# Patient Record
Sex: Male | Born: 1985 | Race: Black or African American | Hispanic: No | Marital: Single
Health system: Southern US, Community
[De-identification: ages and names within clinical notes are randomized; demographics above are authoritative.]

---
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
## 2021-06-11 ENCOUNTER — Emergency Department (HOSPITAL_COMMUNITY)

## 2021-06-11 ENCOUNTER — Emergency Department (HOSPITAL_COMMUNITY)
Admission: EM | Admit: 2021-06-11 | Discharge: 2021-06-12 | Disposition: A | Discharge status: Home / Self Care | Attending: Emergency Medicine | Admitting: Emergency Medicine

## 2021-06-11 ENCOUNTER — Other Ambulatory Visit: Payer: Self-pay

## 2021-06-11 DIAGNOSIS — W010XXA Fall on same level from slipping, tripping and stumbling without subsequent striking against object, initial encounter: Secondary | ICD-10-CM | POA: Diagnosis not present

## 2021-06-11 DIAGNOSIS — M545 Low back pain, unspecified: Secondary | ICD-10-CM | POA: Insufficient documentation

## 2021-06-11 DIAGNOSIS — R200 Anesthesia of skin: Secondary | ICD-10-CM | POA: Insufficient documentation

## 2021-06-11 DIAGNOSIS — M5442 Lumbago with sciatica, left side: Secondary | ICD-10-CM

## 2021-06-11 DIAGNOSIS — Y92149 Unspecified place in prison as the place of occurrence of the external cause: Secondary | ICD-10-CM | POA: Diagnosis not present

## 2021-06-11 DIAGNOSIS — W19XXXA Unspecified fall, initial encounter: Secondary | ICD-10-CM

## 2021-06-11 DIAGNOSIS — M546 Pain in thoracic spine: Secondary | ICD-10-CM | POA: Diagnosis not present

## 2021-06-11 DIAGNOSIS — M542 Cervicalgia: Secondary | ICD-10-CM | POA: Insufficient documentation

## 2021-06-11 IMAGING — CR DG HIP (WITH OR WITHOUT PELVIS) 2-3V*L*
3 series · 3 of 3 positions shown · non-contrast
Comparison: None.

CLINICAL DATA: Mechanical fall on wet surface

EXAM:
CERVICAL SPINE - COMPLETE 4+ VIEW
THORACIC SPINE 2 VIEWS;
LUMBAR SPINE - COMPLETE 4+ VIEW
DG HIP (WITH OR WITHOUT PELVIS) 2-3V LEFT

[pelvis ap]
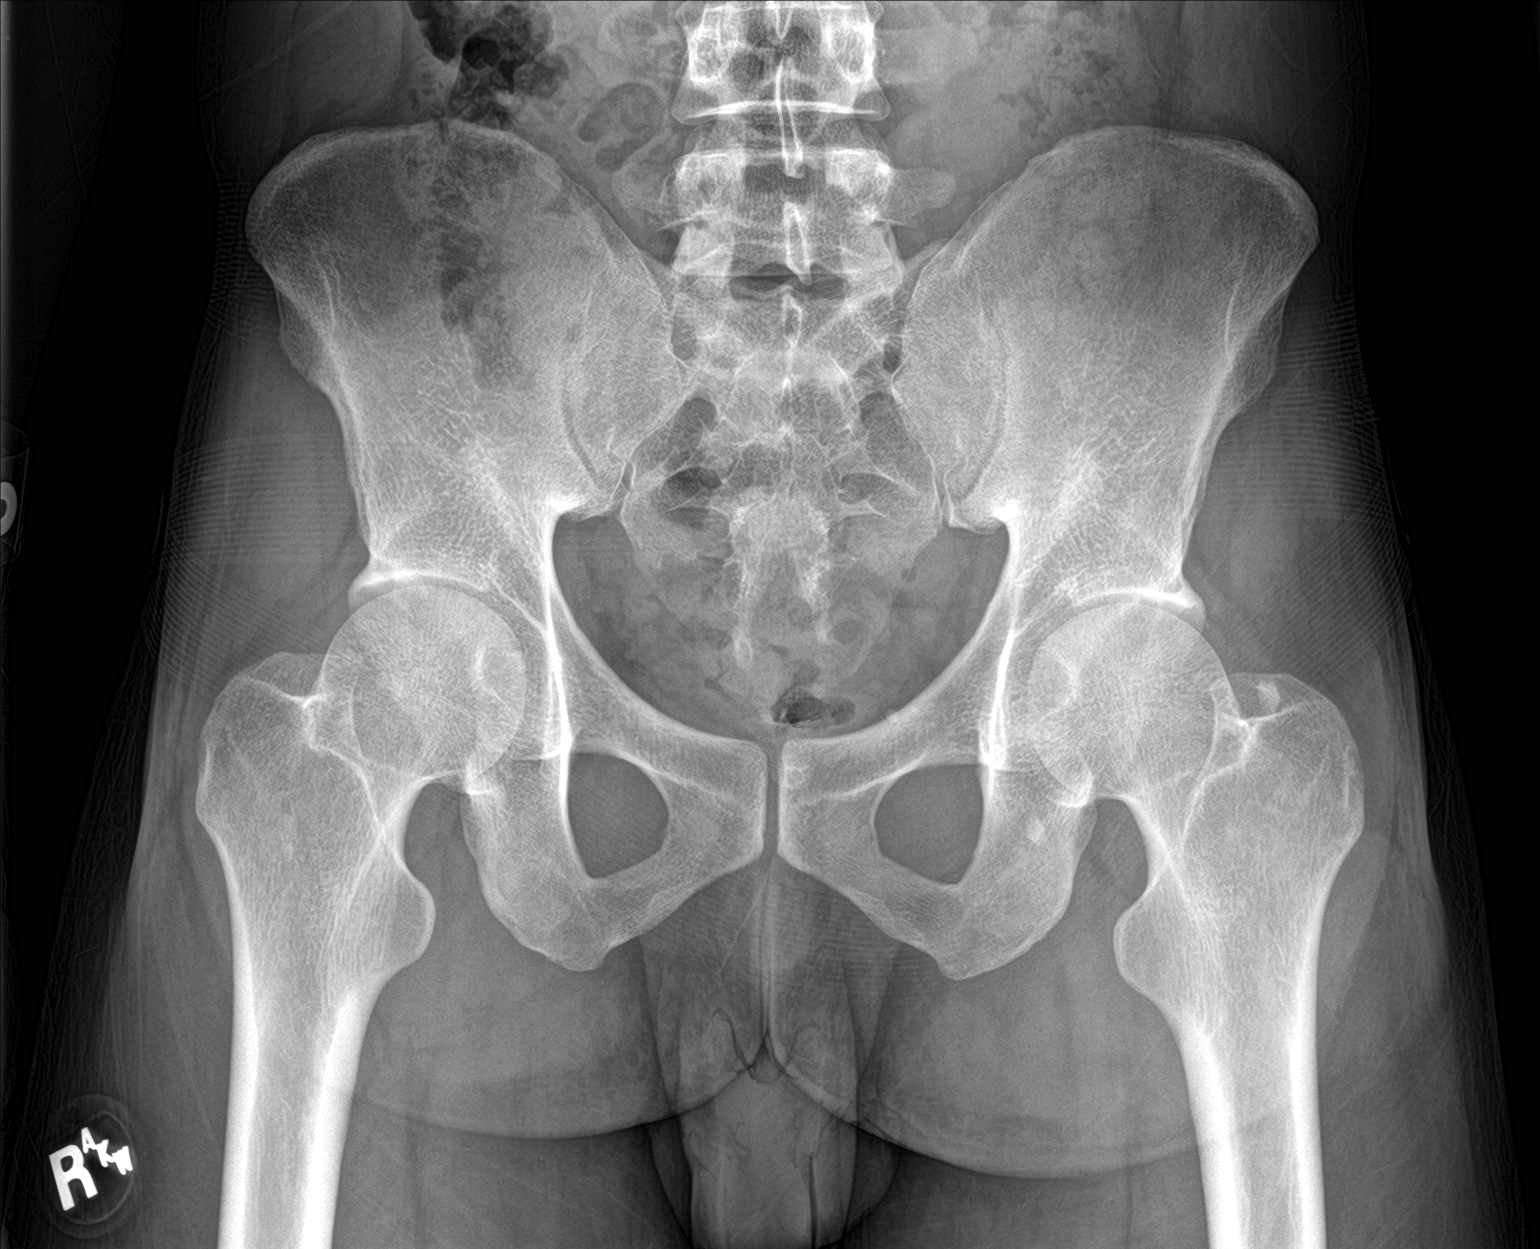

[hip ap]
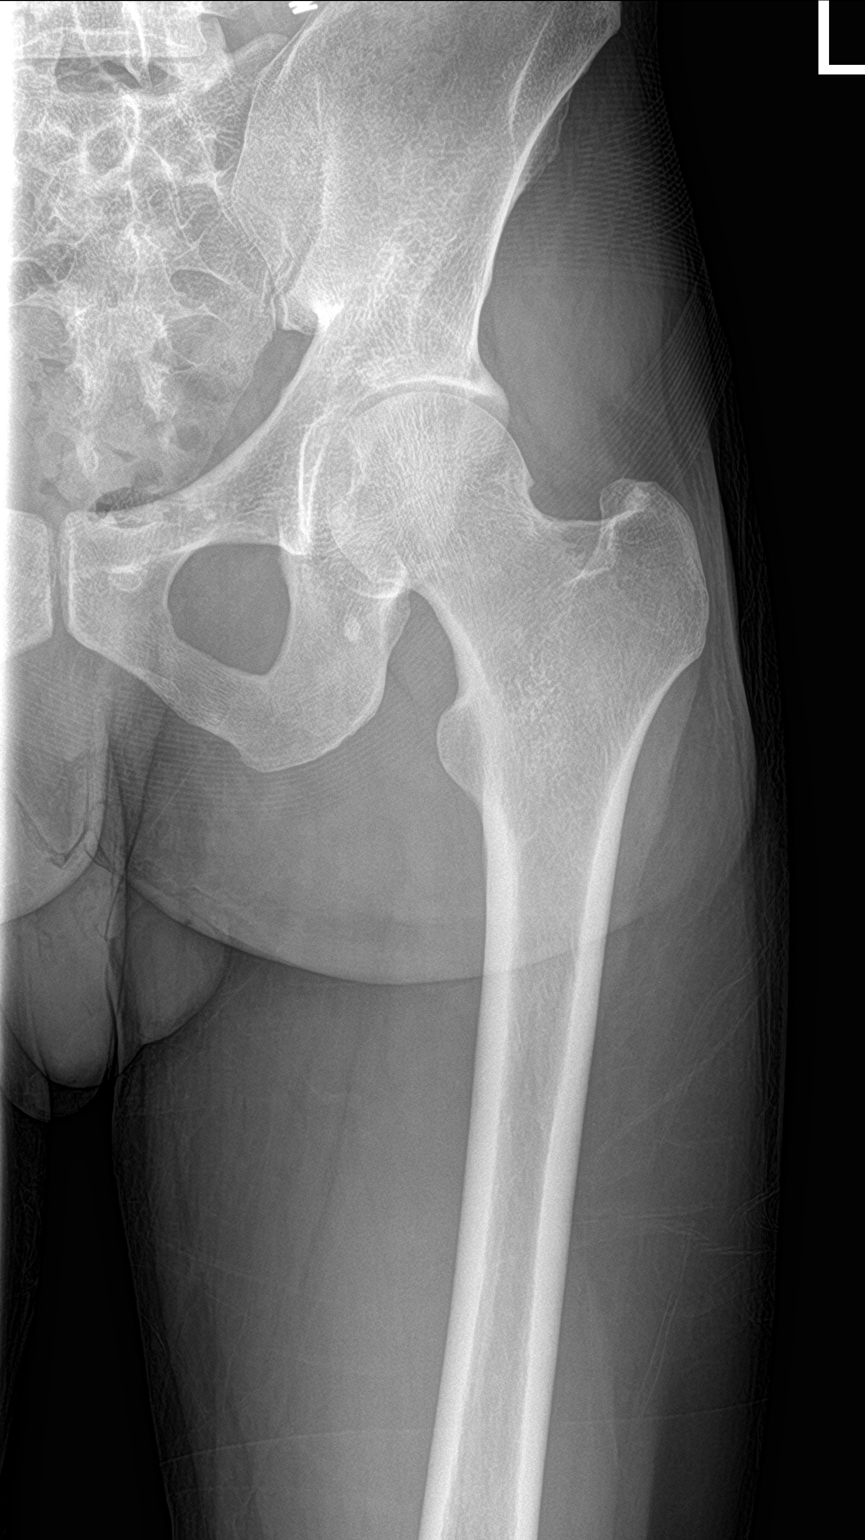

[hip lat]
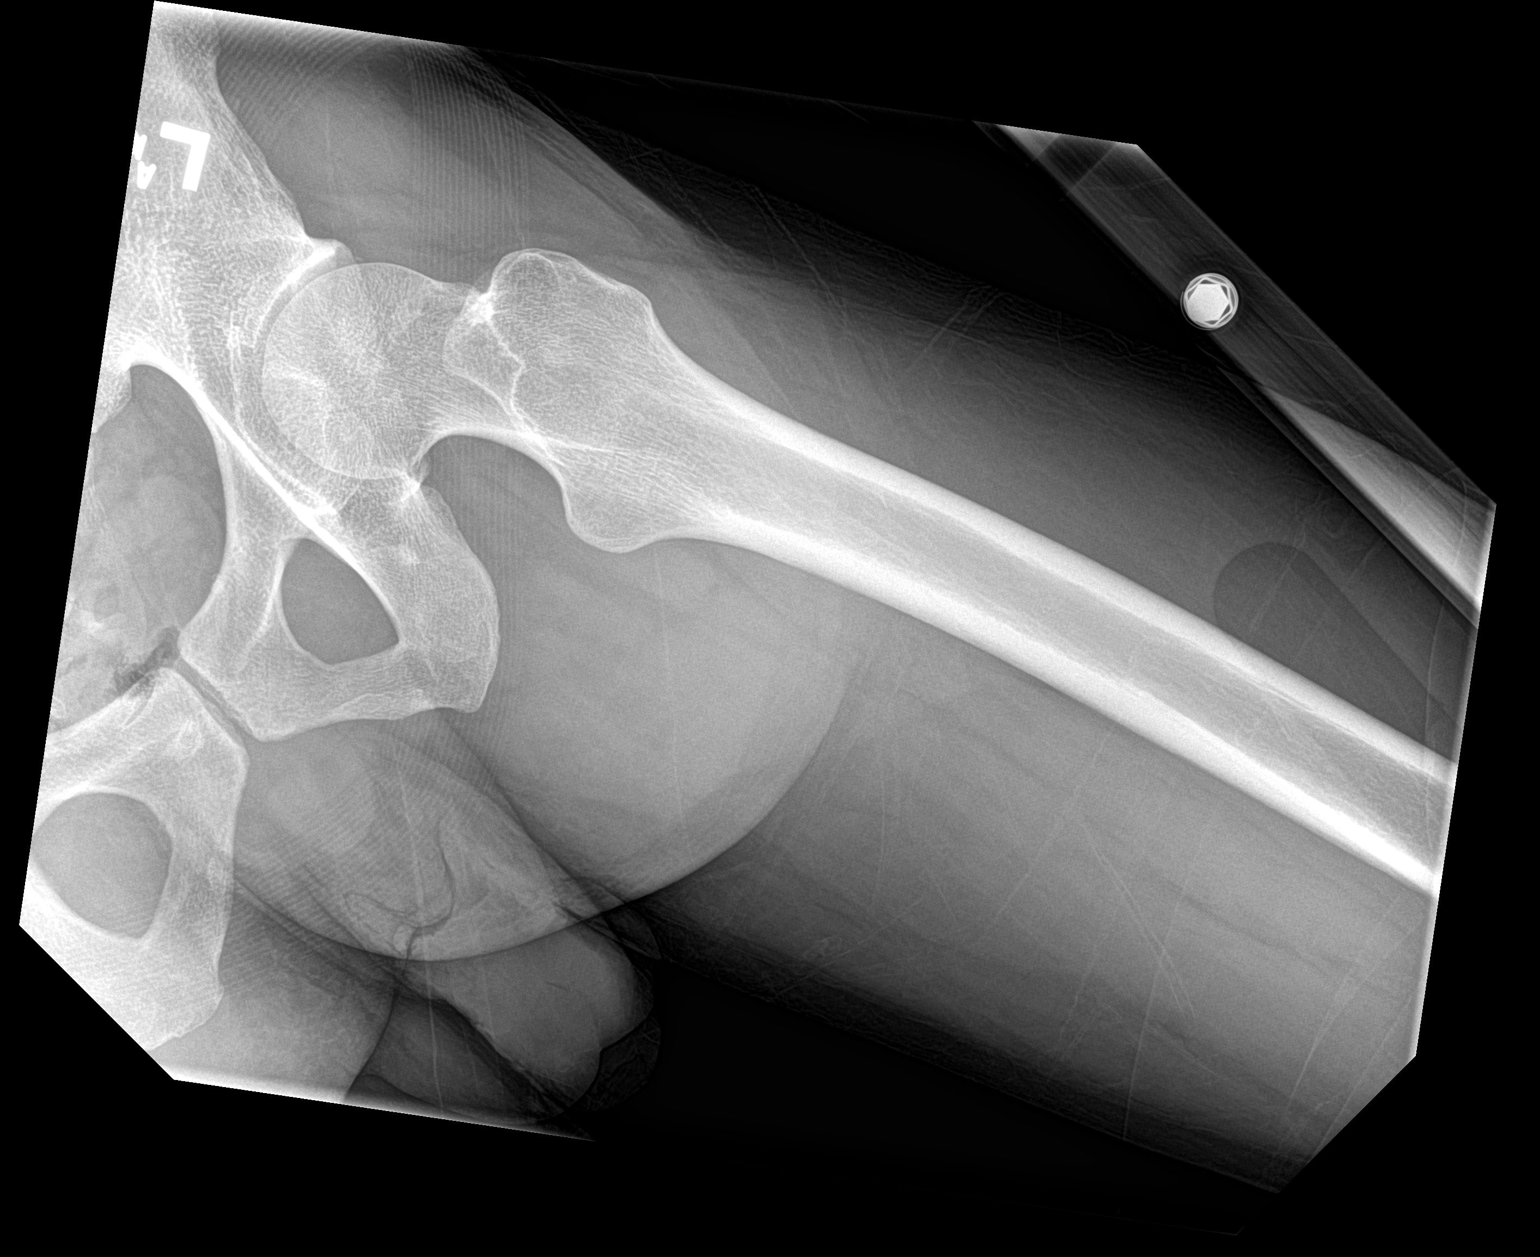

[3 of 3 positions shown; findings below may reference images not displayed]

FINDINGS: Cervical spine:

Stabilization collar in place at the time of exam. No acute cervical
spine fracture or traumatic listhesis. No significant prevertebral
swelling a gas. Airways patent. No acute abnormality in the upper
chest or imaged lung apices.

Thoracic spine:

12 thoracic levels. Upper thoracic levels better visualized on the
cervical spine images. No acute thoracic spine fracture or traumatic
listhesis. Normal bone mineralization. No worrisome osseous lesions.
Included portions of the chest and mediastinum are unremarkable.

Lumbar spine:

Five lumbar levels. No vertebral body fracture or height loss. No
evidence of traumatic listhesis. No spondylolysis or some
spondylolisthesis. Normal bone mineralization. No worrisome osseous
lesions. No acute soft tissue abnormality. Normal bowel gas pattern.

Pelvis:

Bones of the pelvis appear intact and congruent. Question some
partial fusion across the right superior SI joint. Sacral arcs are
contiguous. Proximal femora are intact and normally located. Benign
bone island in the left greater trochanter and inferior pubic ramus.
Soft tissues are unremarkable.
IMPRESSION: No acute traumatic findings in the cervical, thoracic or lumbar
spine.

No acute fracture or traumatic malalignment of the bony pelvis.

## 2021-06-11 IMAGING — CR DG CERVICAL SPINE COMPLETE 4+V
7 series · 7 of 7 positions shown · non-contrast
Comparison: None.

CLINICAL DATA: Mechanical fall on wet surface

EXAM:
CERVICAL SPINE - COMPLETE 4+ VIEW
THORACIC SPINE 2 VIEWS;
LUMBAR SPINE - COMPLETE 4+ VIEW
DG HIP (WITH OR WITHOUT PELVIS) 2-3V LEFT

[c-spine lat]
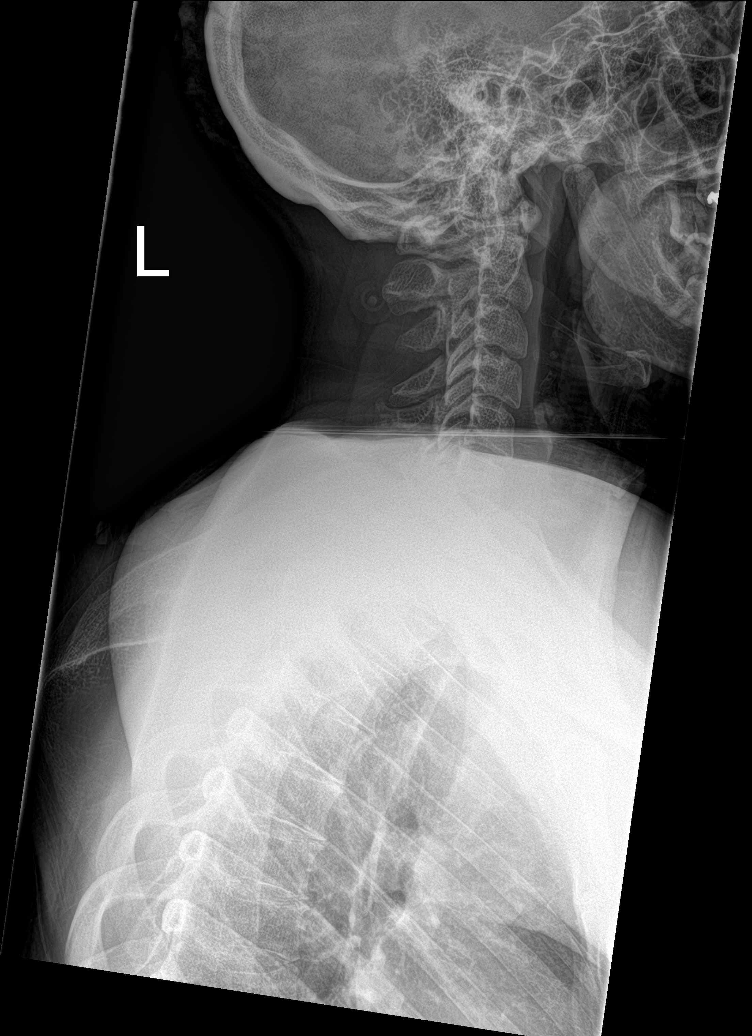

[c-spine obl (1 of 2)]
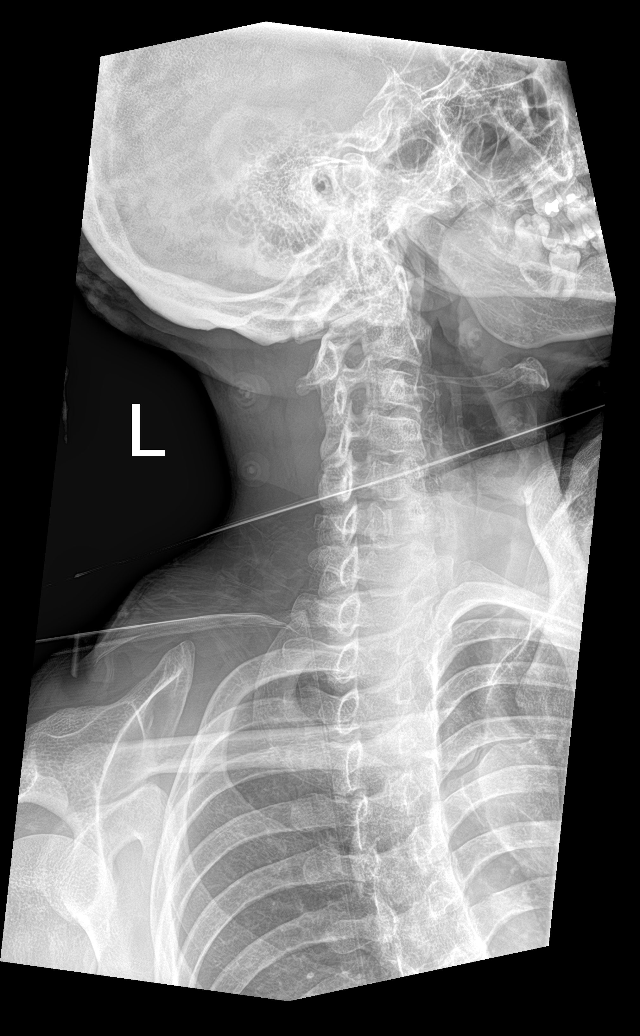

[c-spine obl (2 of 2)]
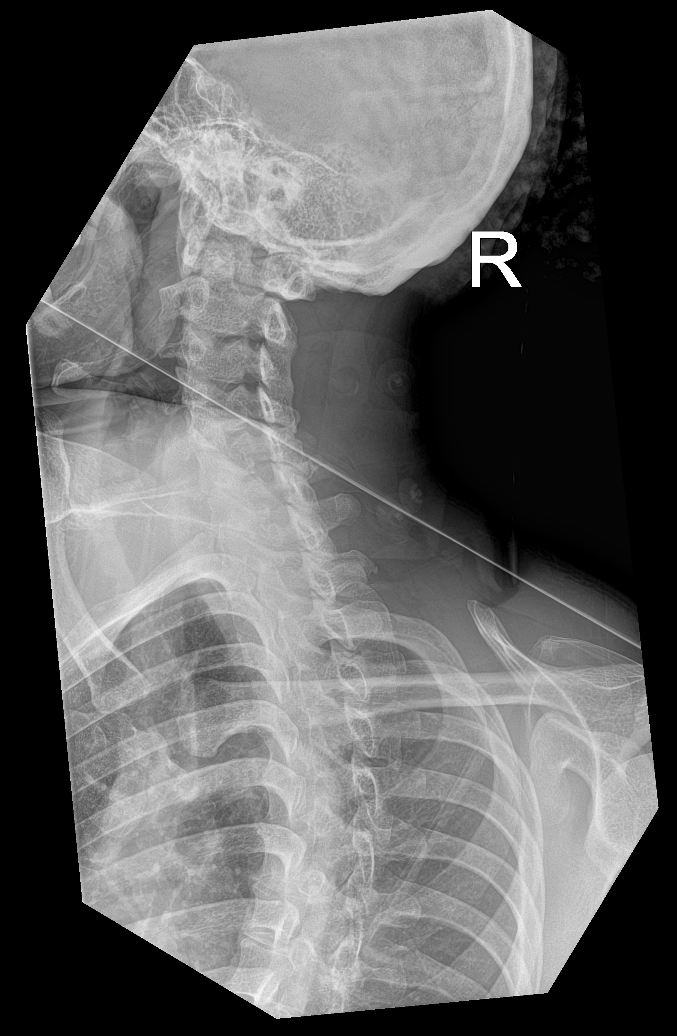

[c-spine open mouth]
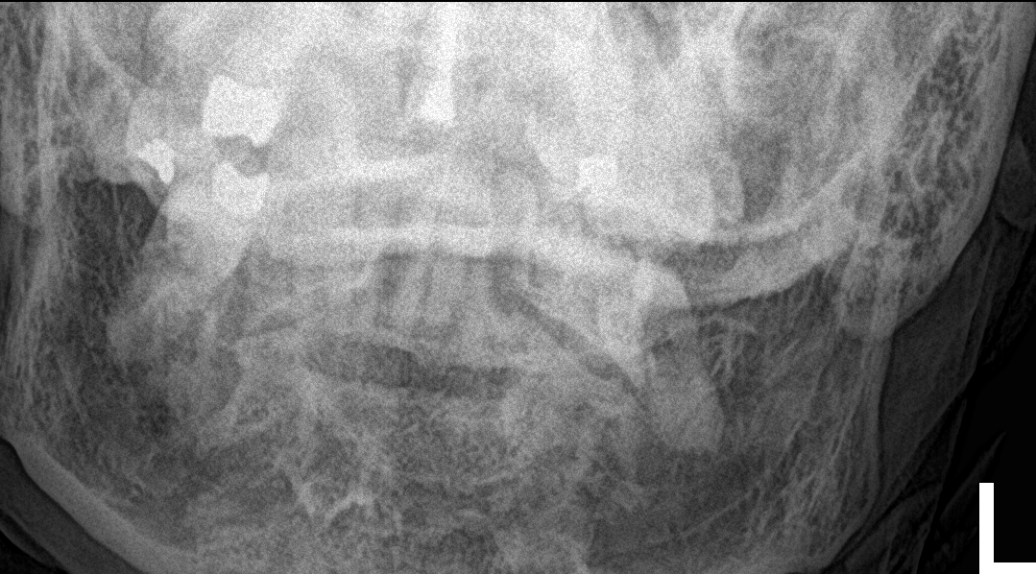

[c-spine swimmers]
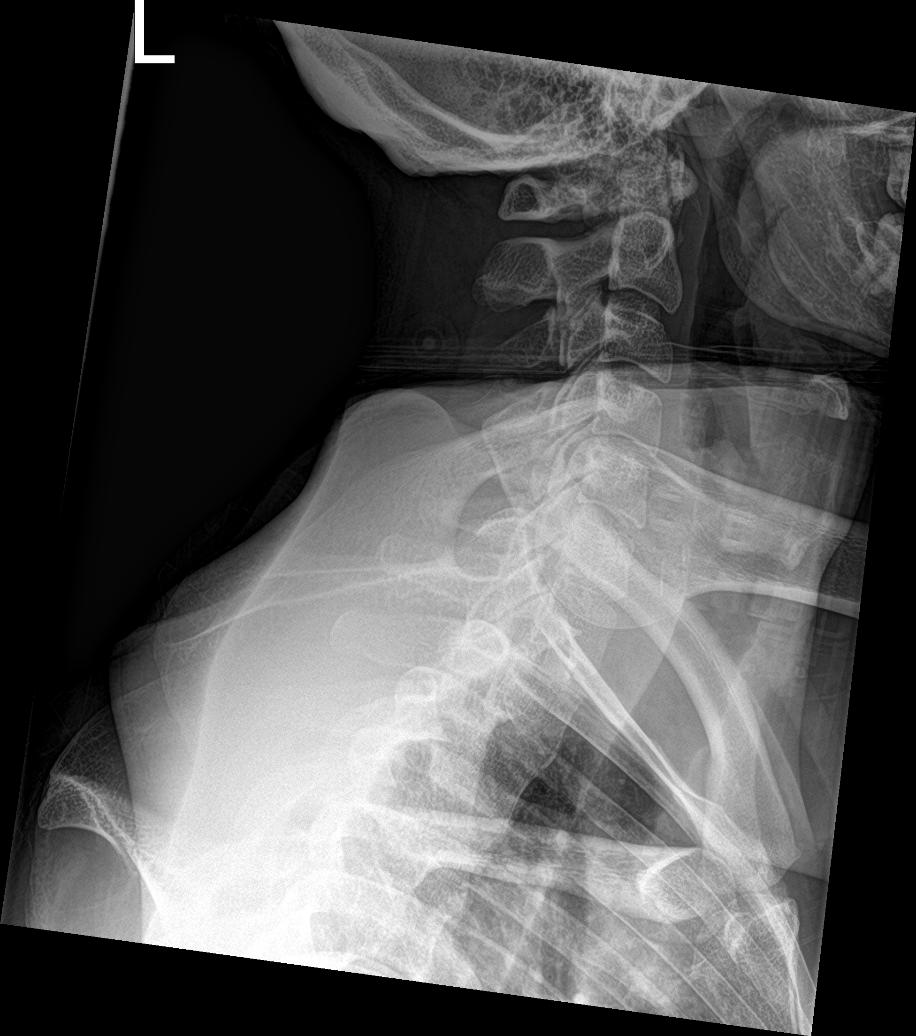

[c-spine ap (1 of 2)]
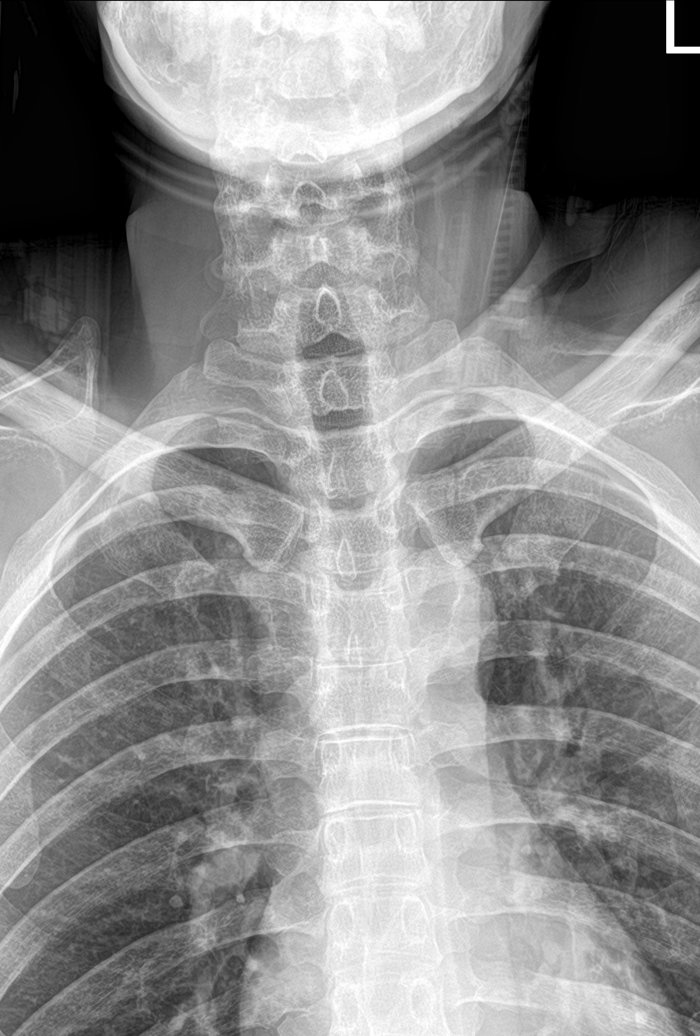

[c-spine ap (2 of 2)]
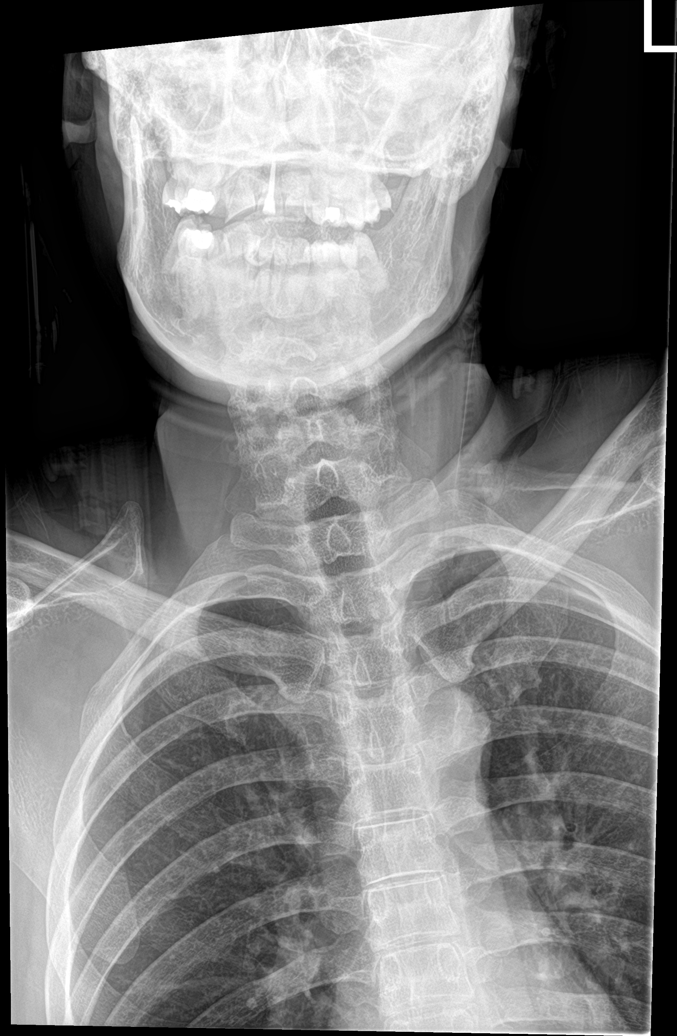

[7 of 7 positions shown; findings below may reference images not displayed]

FINDINGS: Cervical spine:

Stabilization collar in place at the time of exam. No acute cervical
spine fracture or traumatic listhesis. No significant prevertebral
swelling a gas. Airways patent. No acute abnormality in the upper
chest or imaged lung apices.

Thoracic spine:

12 thoracic levels. Upper thoracic levels better visualized on the
cervical spine images. No acute thoracic spine fracture or traumatic
listhesis. Normal bone mineralization. No worrisome osseous lesions.
Included portions of the chest and mediastinum are unremarkable.

Lumbar spine:

Five lumbar levels. No vertebral body fracture or height loss. No
evidence of traumatic listhesis. No spondylolysis or some
spondylolisthesis. Normal bone mineralization. No worrisome osseous
lesions. No acute soft tissue abnormality. Normal bowel gas pattern.

Pelvis:

Bones of the pelvis appear intact and congruent. Question some
partial fusion across the right superior SI joint. Sacral arcs are
contiguous. Proximal femora are intact and normally located. Benign
bone island in the left greater trochanter and inferior pubic ramus.
Soft tissues are unremarkable.
IMPRESSION: No acute traumatic findings in the cervical, thoracic or lumbar
spine.

No acute fracture or traumatic malalignment of the bony pelvis.

## 2021-06-11 IMAGING — CR DG LUMBAR SPINE COMPLETE 4+V
6 series · 6 of 6 positions shown · non-contrast
Comparison: None.

CLINICAL DATA: Mechanical fall on wet surface

EXAM:
CERVICAL SPINE - COMPLETE 4+ VIEW
THORACIC SPINE 2 VIEWS;
LUMBAR SPINE - COMPLETE 4+ VIEW
DG HIP (WITH OR WITHOUT PELVIS) 2-3V LEFT

[l-spine ap]
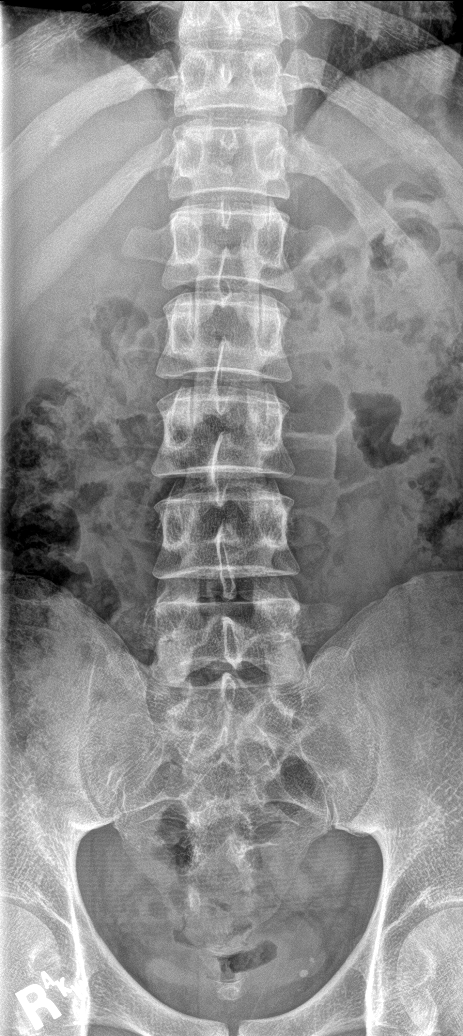

[l-spine obl (1 of 3)]
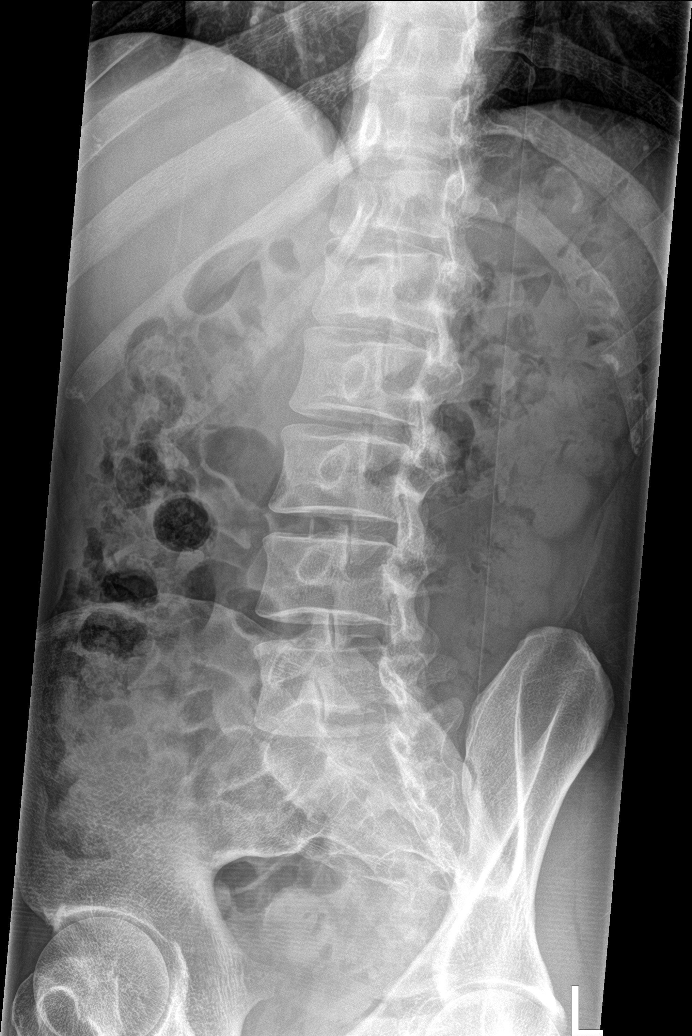

[l-spine obl (2 of 3)]
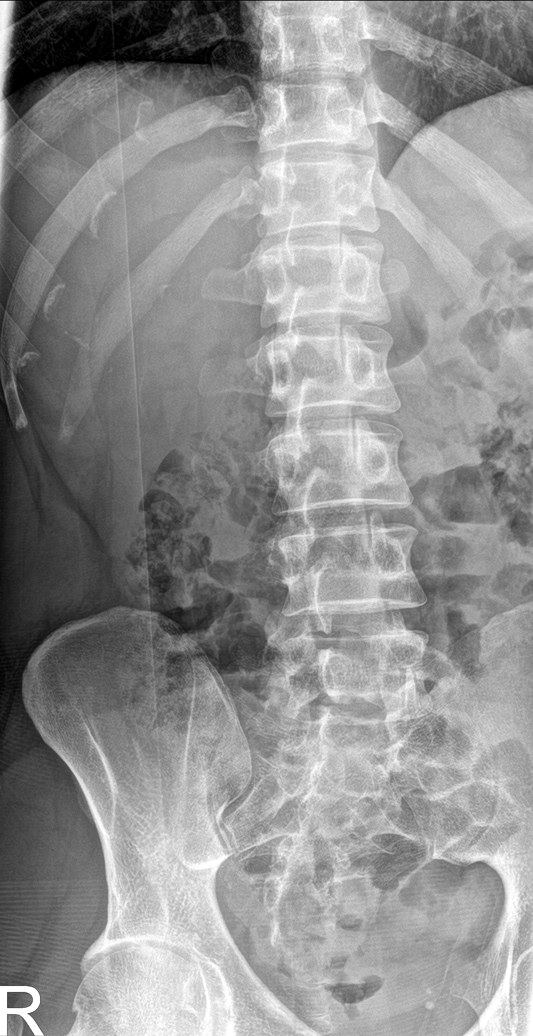

[l-spine lat]
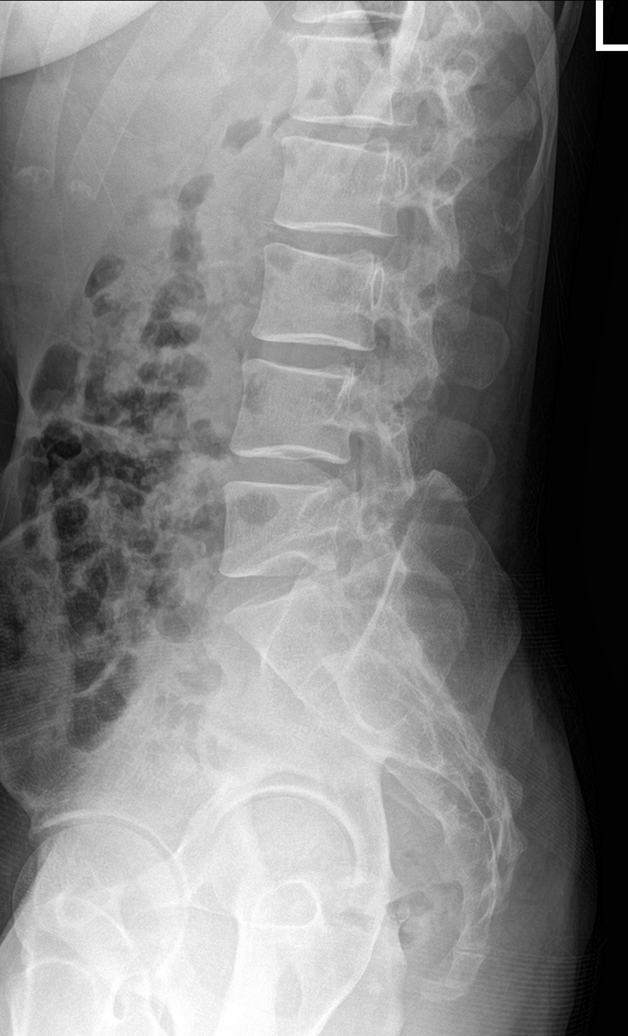

[l-spine spot]
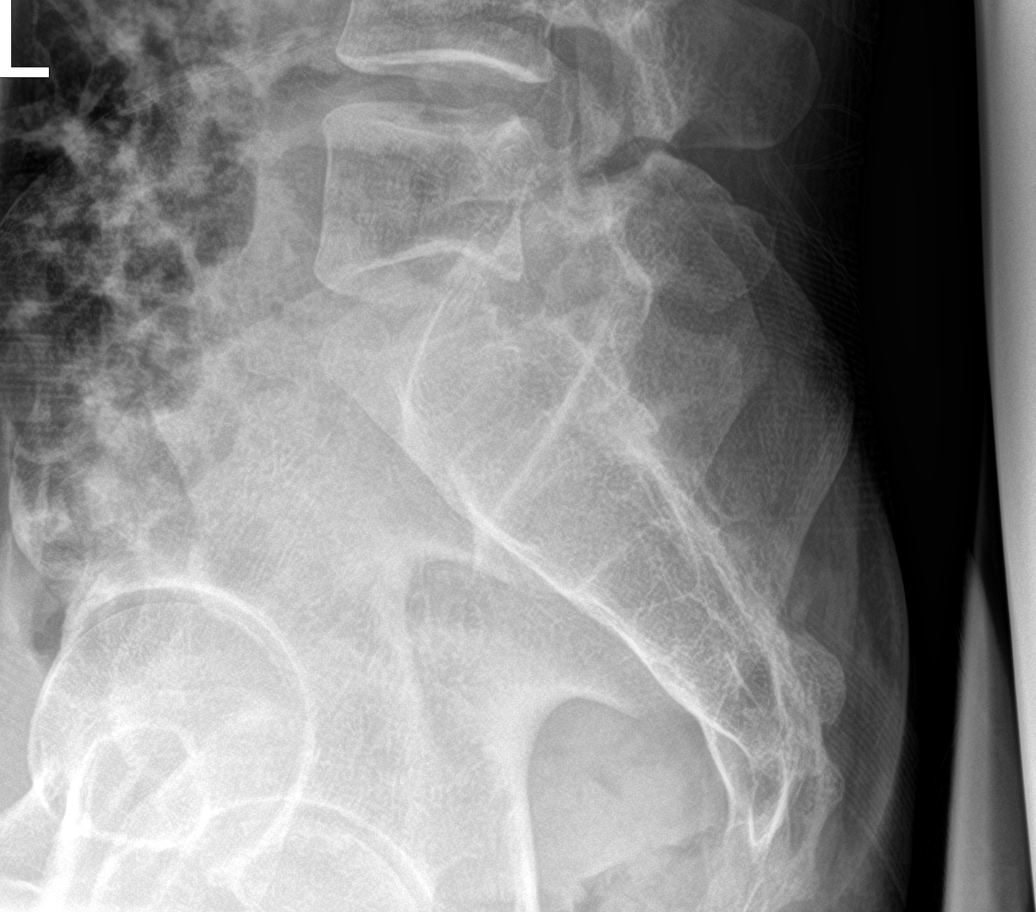

[l-spine obl (3 of 3)]
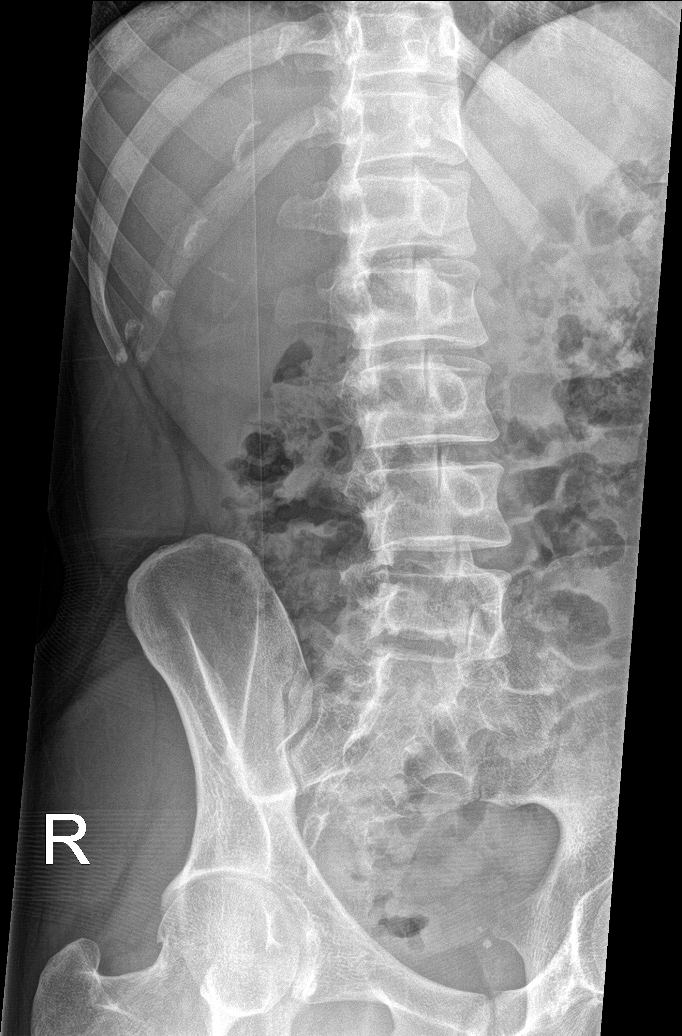

[6 of 6 positions shown; findings below may reference images not displayed]

FINDINGS: Cervical spine:

Stabilization collar in place at the time of exam. No acute cervical
spine fracture or traumatic listhesis. No significant prevertebral
swelling a gas. Airways patent. No acute abnormality in the upper
chest or imaged lung apices.

Thoracic spine:

12 thoracic levels. Upper thoracic levels better visualized on the
cervical spine images. No acute thoracic spine fracture or traumatic
listhesis. Normal bone mineralization. No worrisome osseous lesions.
Included portions of the chest and mediastinum are unremarkable.

Lumbar spine:

Five lumbar levels. No vertebral body fracture or height loss. No
evidence of traumatic listhesis. No spondylolysis or some
spondylolisthesis. Normal bone mineralization. No worrisome osseous
lesions. No acute soft tissue abnormality. Normal bowel gas pattern.

Pelvis:

Bones of the pelvis appear intact and congruent. Question some
partial fusion across the right superior SI joint. Sacral arcs are
contiguous. Proximal femora are intact and normally located. Benign
bone island in the left greater trochanter and inferior pubic ramus.
Soft tissues are unremarkable.
IMPRESSION: No acute traumatic findings in the cervical, thoracic or lumbar
spine.

No acute fracture or traumatic malalignment of the bony pelvis.

## 2021-06-11 IMAGING — CR DG THORACIC SPINE 2V
2 series · 2 of 2 positions shown · non-contrast
Comparison: None.

CLINICAL DATA: Mechanical fall on wet surface

EXAM:
CERVICAL SPINE - COMPLETE 4+ VIEW
THORACIC SPINE 2 VIEWS;
LUMBAR SPINE - COMPLETE 4+ VIEW
DG HIP (WITH OR WITHOUT PELVIS) 2-3V LEFT

[t-spine ap]
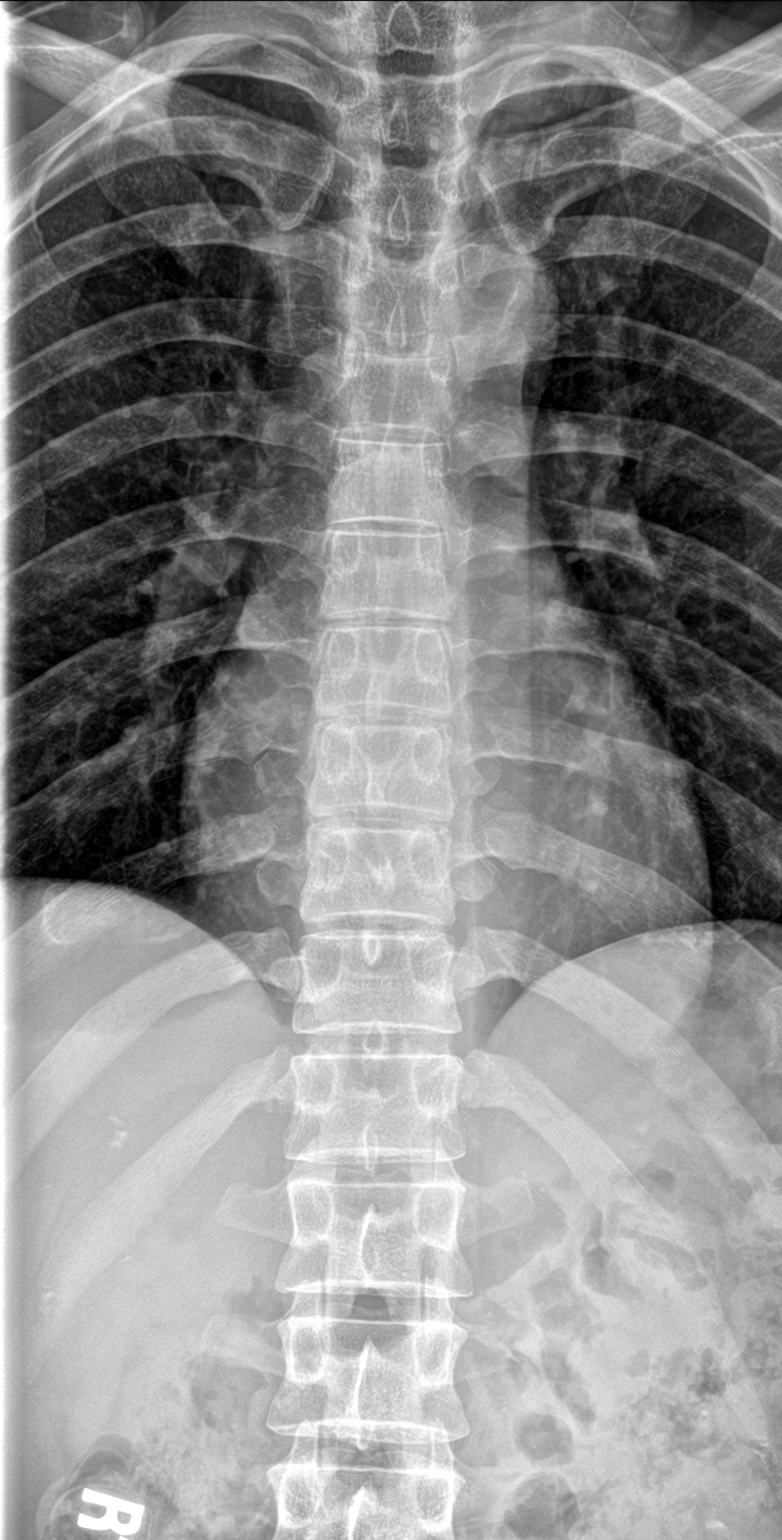

[t-spine lat]
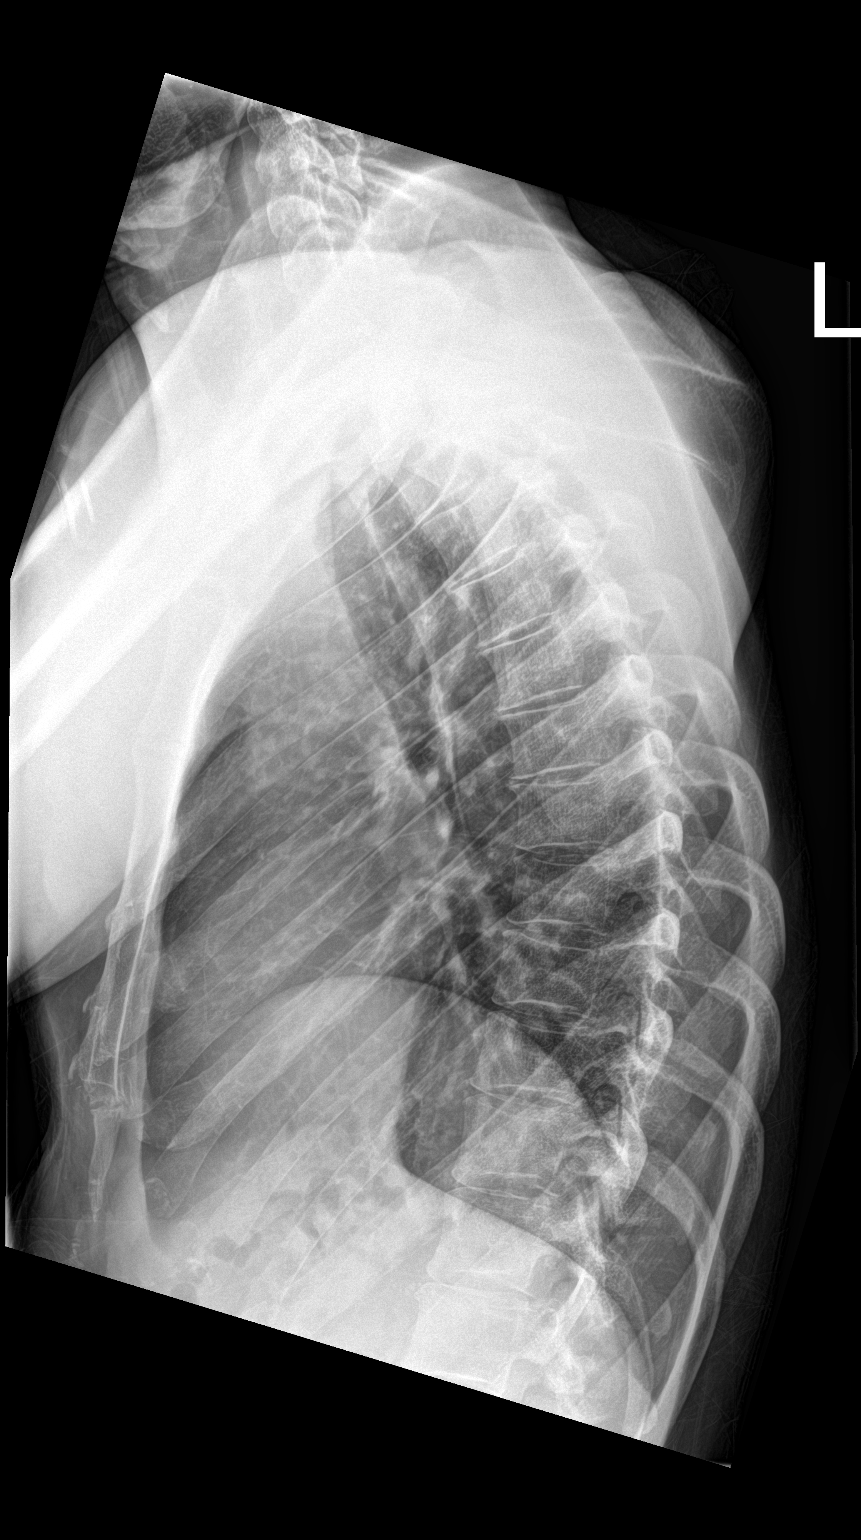

[2 of 2 positions shown; findings below may reference images not displayed]

FINDINGS: Cervical spine:

Stabilization collar in place at the time of exam. No acute cervical
spine fracture or traumatic listhesis. No significant prevertebral
swelling a gas. Airways patent. No acute abnormality in the upper
chest or imaged lung apices.

Thoracic spine:

12 thoracic levels. Upper thoracic levels better visualized on the
cervical spine images. No acute thoracic spine fracture or traumatic
listhesis. Normal bone mineralization. No worrisome osseous lesions.
Included portions of the chest and mediastinum are unremarkable.

Lumbar spine:

Five lumbar levels. No vertebral body fracture or height loss. No
evidence of traumatic listhesis. No spondylolysis or some
spondylolisthesis. Normal bone mineralization. No worrisome osseous
lesions. No acute soft tissue abnormality. Normal bowel gas pattern.

Pelvis:

Bones of the pelvis appear intact and congruent. Question some
partial fusion across the right superior SI joint. Sacral arcs are
contiguous. Proximal femora are intact and normally located. Benign
bone island in the left greater trochanter and inferior pubic ramus.
Soft tissues are unremarkable.
IMPRESSION: No acute traumatic findings in the cervical, thoracic or lumbar
spine.

No acute fracture or traumatic malalignment of the bony pelvis.

## 2021-06-11 MED ORDER — IBUPROFEN 200 MG PO TABS
600.0000 mg | ORAL_TABLET | Freq: Once | ORAL | Status: AC
  Administered 2021-06-11: 600 mg via ORAL
  Filled 2021-06-11: qty 1

## 2021-06-11 NOTE — ED Provider Notes (Signed)
MSE was initiated and I personally evaluated the patient and placed orders (if any) at  10:48 PM on June 11, 2021.  Patient to ED with [facility] from jail after mechanical fall, slipped on wet surface, went backward landing on floor. C/O neck, thoracic and low back pain. No LOC, nausea, vomiting, chest or abdominal pain.   Today's Vitals   06/11/21 2243  BP: (!) 134/94  Resp: 16  Temp: 98.2 F (36.8 C)  TempSrc: Oral  SpO2: 100%   There is no height or weight on file to calculate BMI.  In collar Spine tender over cervical, upper thoracic, lumbar spine. No deformity or step off.   The patient appears stable so that the remainder of the MSE may be completed by another provider.   Elpidio Anis, PA-C 06/11/21 2250    Maia Plan, MD 06/16/21 1301

## 2021-06-11 NOTE — ED Triage Notes (Signed)
Pt arrives via [facility] from [place]. slipped in water and fell onto back, no LOC. C/o left side, left shoulder and lower back pain. C collar in place. 134/98, hr 98, 99% RA.

## 2021-06-12 ENCOUNTER — Emergency Department (HOSPITAL_COMMUNITY)

## 2021-06-12 IMAGING — MR MR THORACIC SPINE W/O CM
4 of 6 series · 21 of 48 positions shown · non-contrast
Comparison: Thoracolumbar radiographs [BB] hours yesterday.

CLINICAL DATA: 34-year-old male status post fall backwards with
thoracolumbar pain.

EXAM:
MRI THORACIC SPINE WITHOUT CONTRAST
TECHNIQUE: Multiplanar, multisequence MR imaging of the thoracic spine was
performed. No intravenous contrast was administered.

[Series 18: T1 · sagittal · 3.3mm · 0.62mm/px · 3 of 9 slices shown (1 of 2)]
[im 1/9]
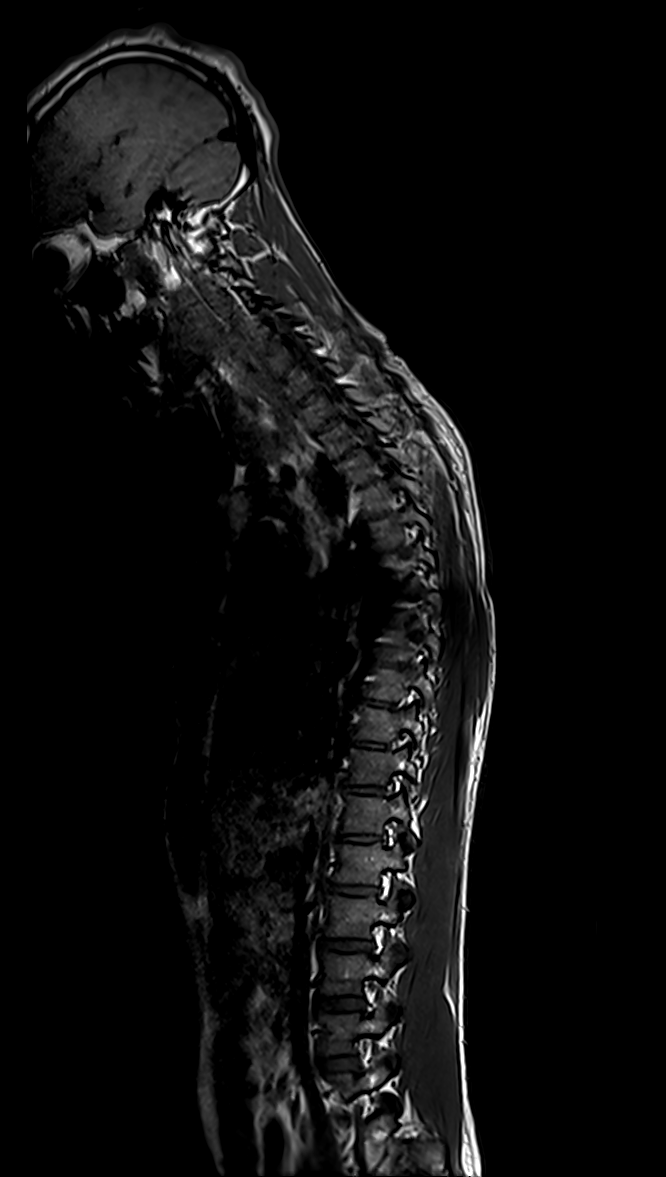
[im 6/9]
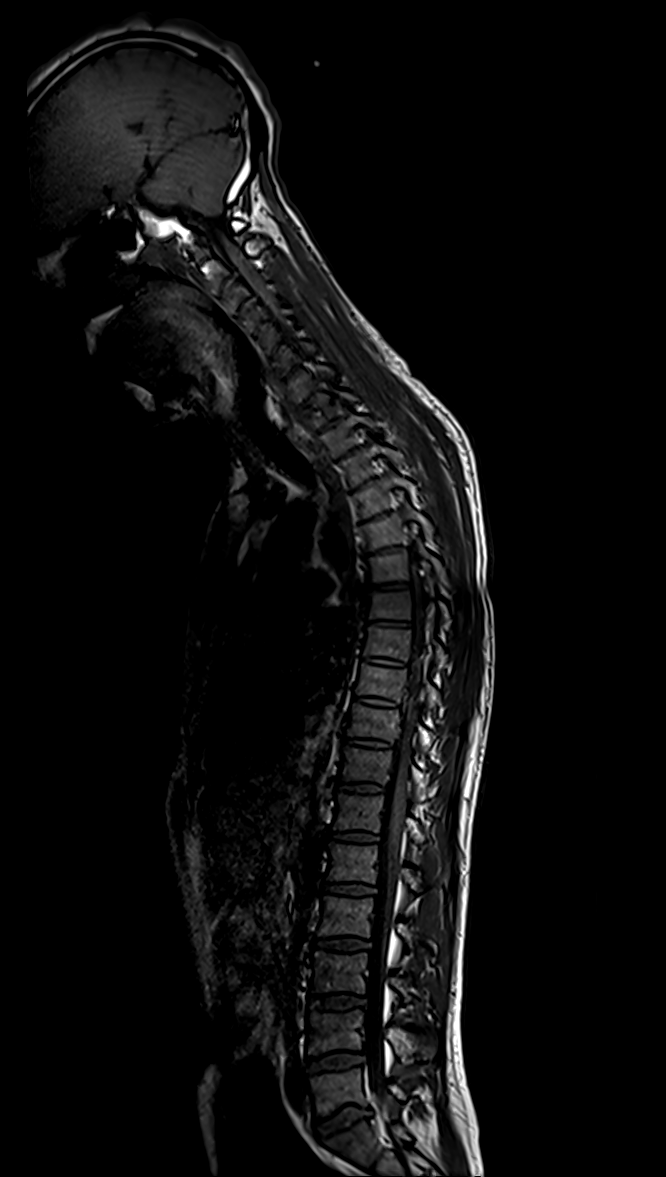
[im 9/9]
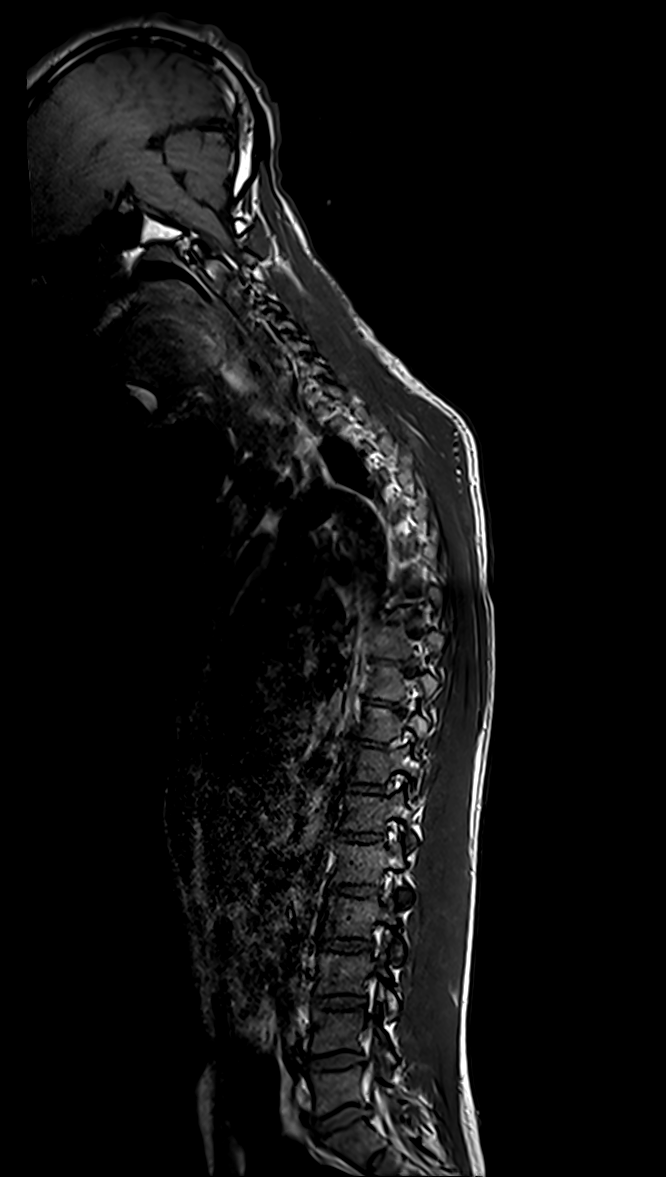

[Series 19: T2 · sagittal · 3.0mm · 0.76mm/px · 6 of 19 slices shown (1 of 2)]
[im 1/19]
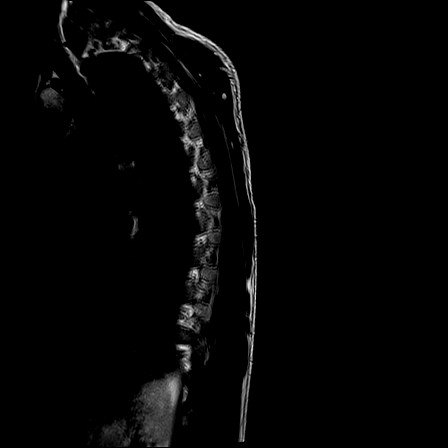
[im 4/19]
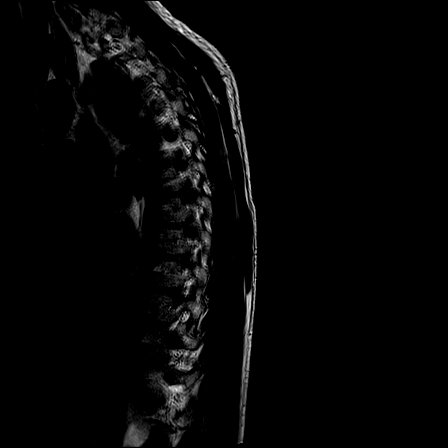
[im 8/19]
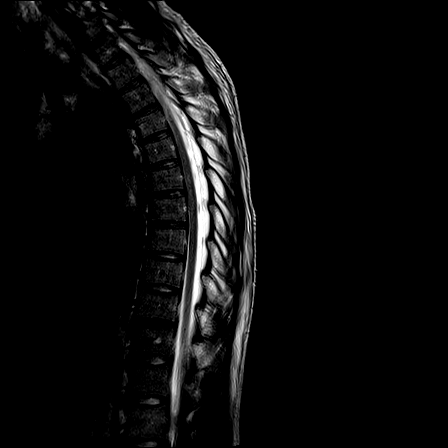
[im 11/19]
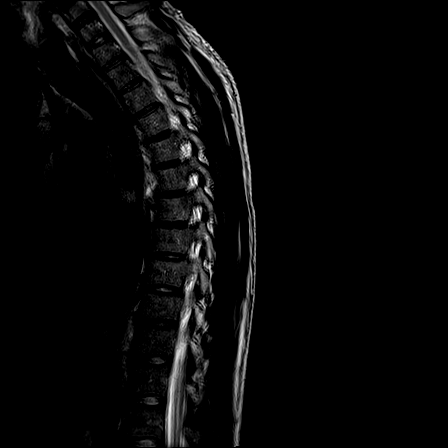
[im 15/19]
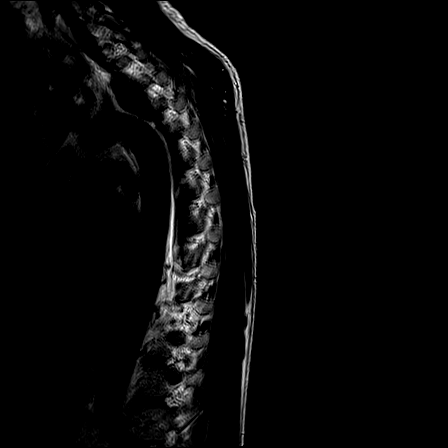
[im 19/19]
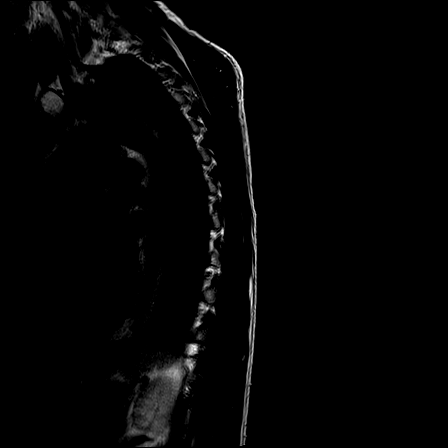

[Series 20: T1 · sagittal · 3.0mm · 0.76mm/px · 3 of 19 slices shown (2 of 2)]
[im 4/19]
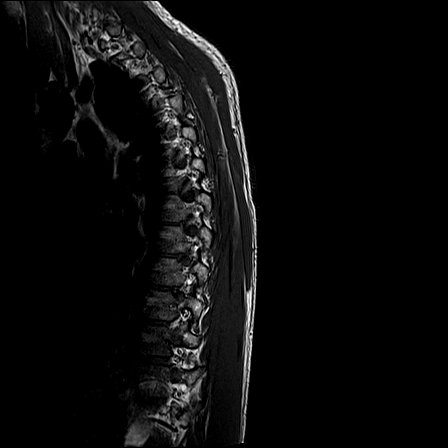
[im 11/19]
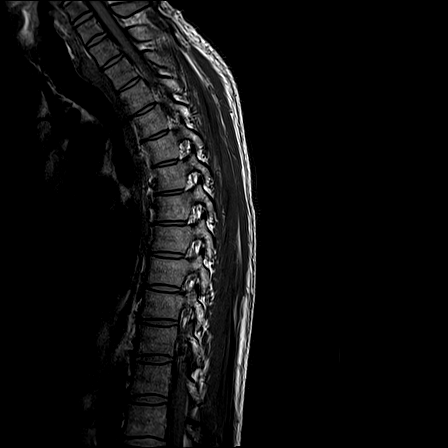
[im 19/19]
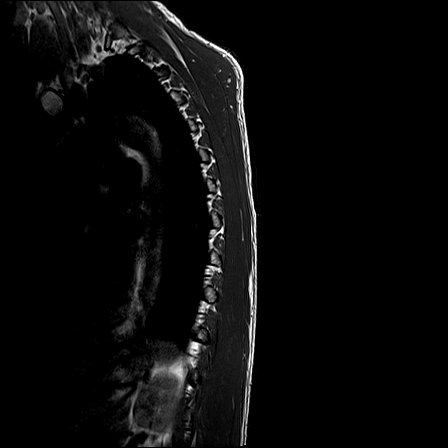

[Series 22: T2 · axial · 5.0mm · 0.59mm/px · z∈[-166,+71]mm · 9 of 39 slices shown (2 of 2)]
[im 1/39]
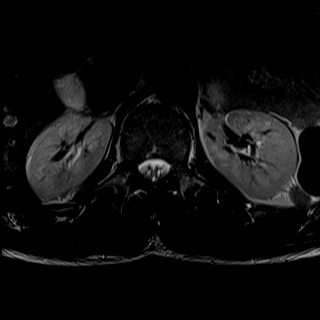
[im 7/39]
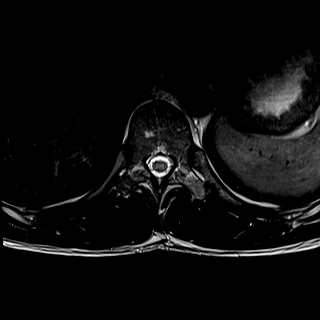
[im 13/39]
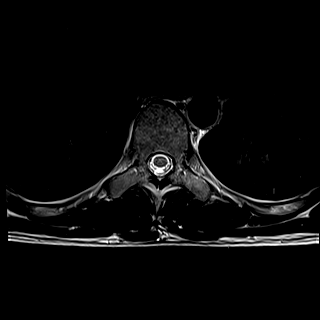
[im 16/39]
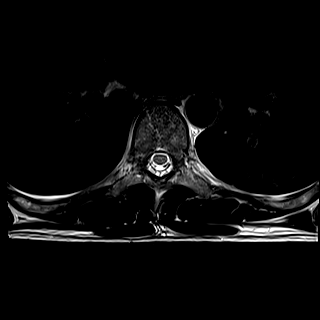
[im 20/39]
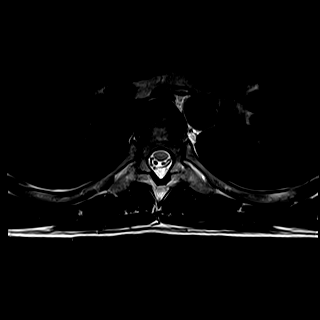
[im 23/39]
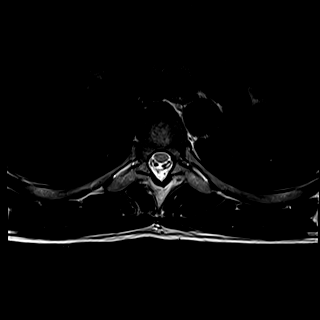
[im 26/39]
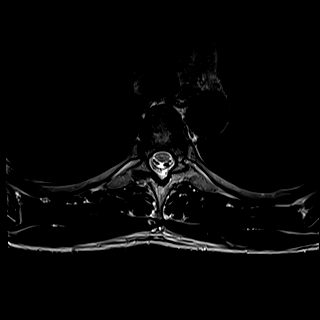
[im 32/39]
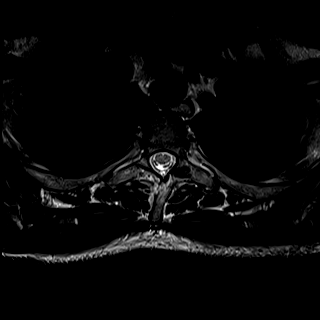
[im 39/39]
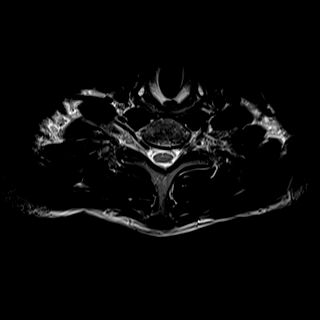

[21 of 48 positions shown; findings below may reference images not displayed]

FINDINGS: Limited cervical spine imaging: Straightening of cervical lordosis
but otherwise negative.

Thoracic spine segmentation:  Normal on the comparison.

Alignment: Mildly exaggerated thoracic kyphosis but otherwise
normal.

Vertebrae: No marrow edema or evidence of acute osseous abnormality.
Preserved vertebral body height. Visualized bone marrow signal is
within normal limits.

Cord:  Normal.  Conus medullaris at T12-L1.

Paraspinal and other soft tissues: Negative visible thoracic and
upper abdominal viscera. Normal thoracic paraspinal soft tissues.

Disc levels:

Mild thoracic epidural lipomatosis most pronounced at T6 and T7. But
no significant associated spinal stenosis. Superimposed degenerative
changes:

T1-T2: Negative.

T2-T3: Negative.

T3-T4: Negative.

T4-T5: Negative.

T5-T6: Negative.

T6-T7: Negative.

T7-T8: Negative.

T8-T9: Negative.

T9-T10: Negative.

T10-T11: Negative.

T11-T12: Negative.

T12-L1: Negative.
IMPRESSION: Essentially normal MRI appearance of the thoracic spine.

Mildly exaggerated thoracic kyphosis and mild thoracic epidural
lipomatosis.

## 2021-06-12 IMAGING — MR MR LUMBAR SPINE W/O CM
4 of 5 series · 26 of 48 positions shown · non-contrast
Comparison: Thoracic MRI today. Thoracolumbar radiographs [NF]
hours last night.

CLINICAL DATA: 34-year-old male status post fall backwards with
thoracolumbar pain.

EXAM:
MRI LUMBAR SPINE WITHOUT CONTRAST
TECHNIQUE: Multiplanar, multisequence MR imaging of the lumbar spine was
performed. No intravenous contrast was administered.

[Series 1: T2 · sagittal · 4.0mm · 0.73mm/px · 6 of 16 slices shown (1 of 2)]
[im 1/16]
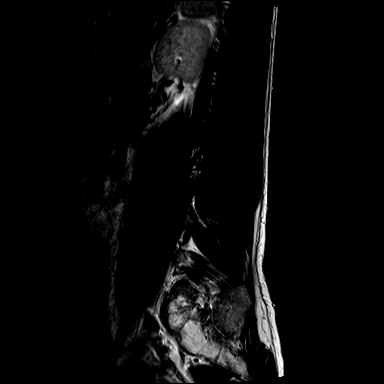
[im 4/16]
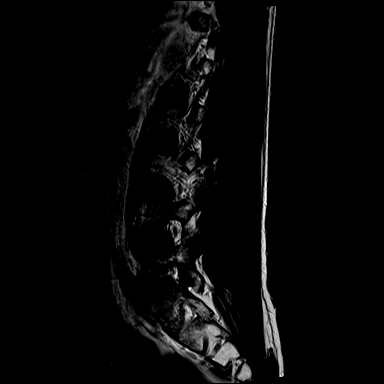
[im 7/16]
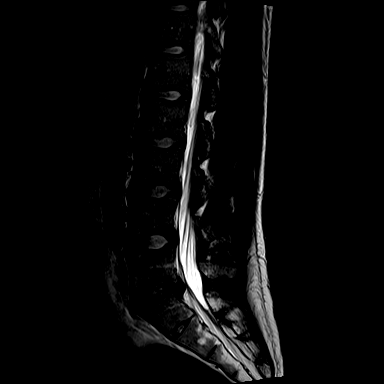
[im 10/16]
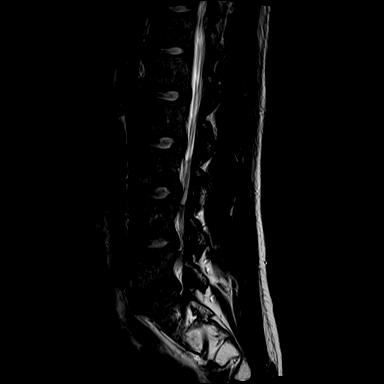
[im 13/16]
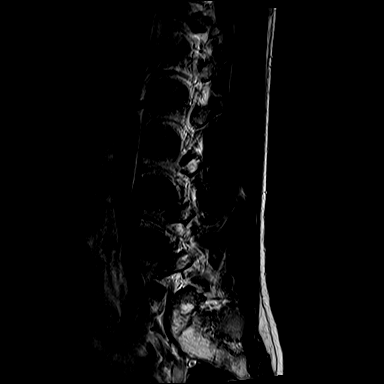
[im 16/16]
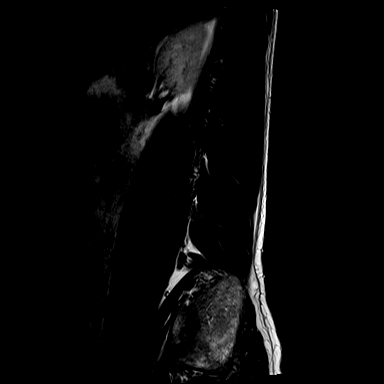

[Series 3: T1 · sagittal · 4.0mm · 0.88mm/px · 7 of 16 slices shown (1 of 2)]
[im 1/16]
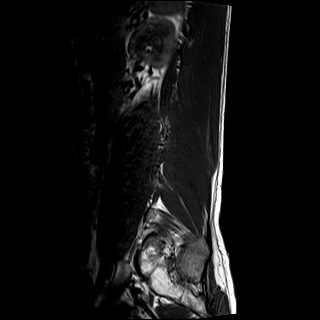
[im 3/16]
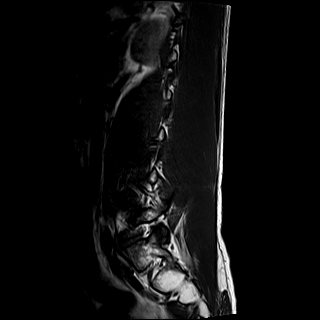
[im 6/16]
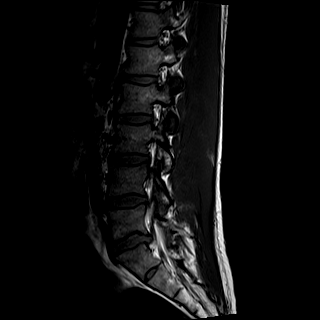
[im 8/16]
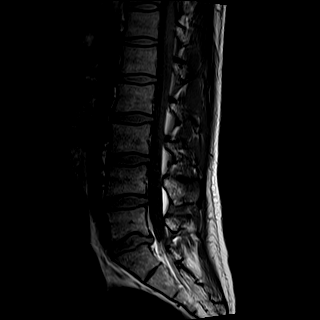
[im 11/16]
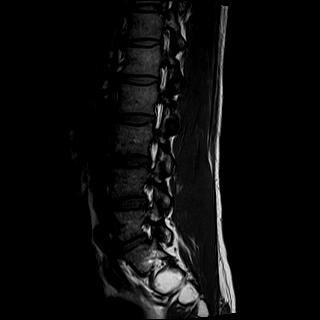
[im 13/16]
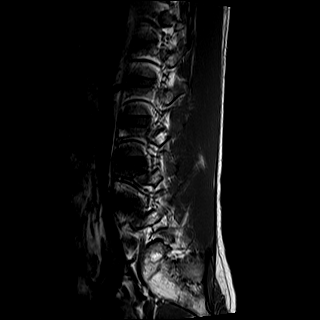
[im 16/16]
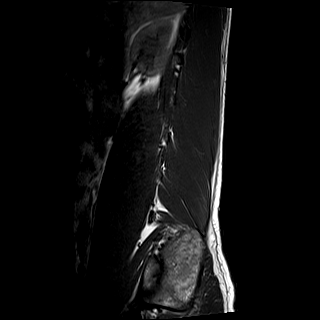

[Series 4: T2 · axial · 5.0mm · 0.57mm/px · z∈[-354,-114]mm · 8 of 31 slices shown (2 of 2)]
[im 1/31]
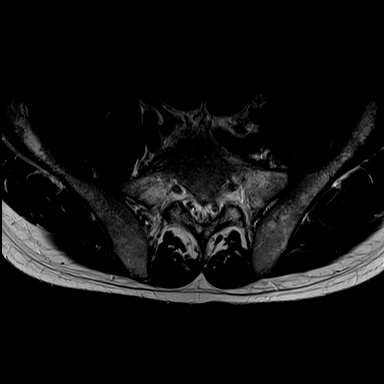
[im 5/31]
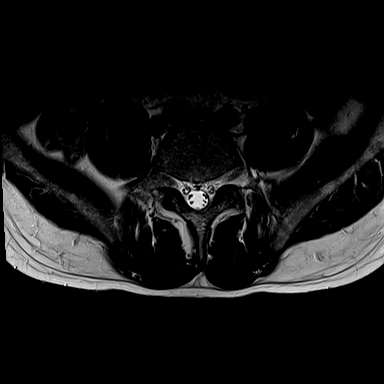
[im 10/31]
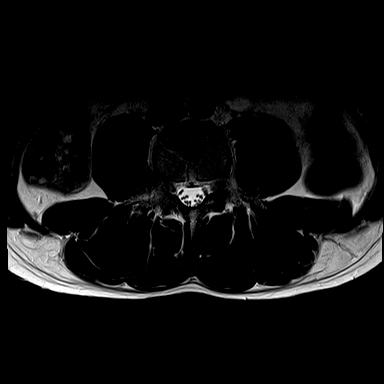
[im 14/31]
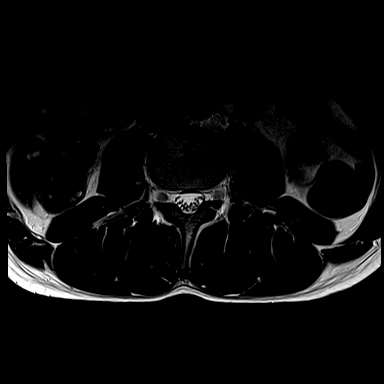
[im 17/31]
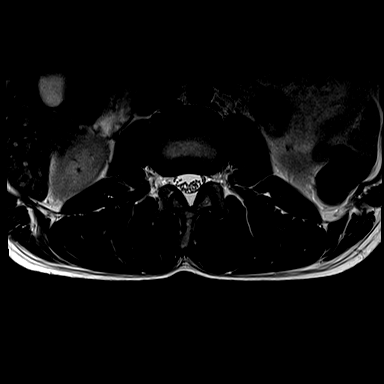
[im 21/31]
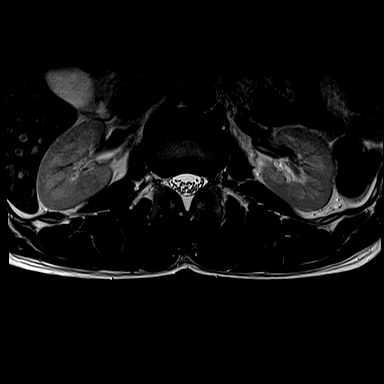
[im 26/31]
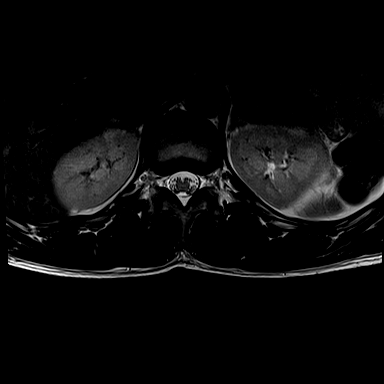
[im 31/31]
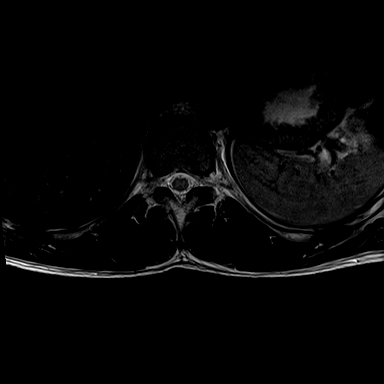

[Series 5: T1 · axial · 5.0mm · 0.34mm/px · z∈[-354,-151]mm · 5 of 31 slices shown (2 of 2)]
[im 1/31]
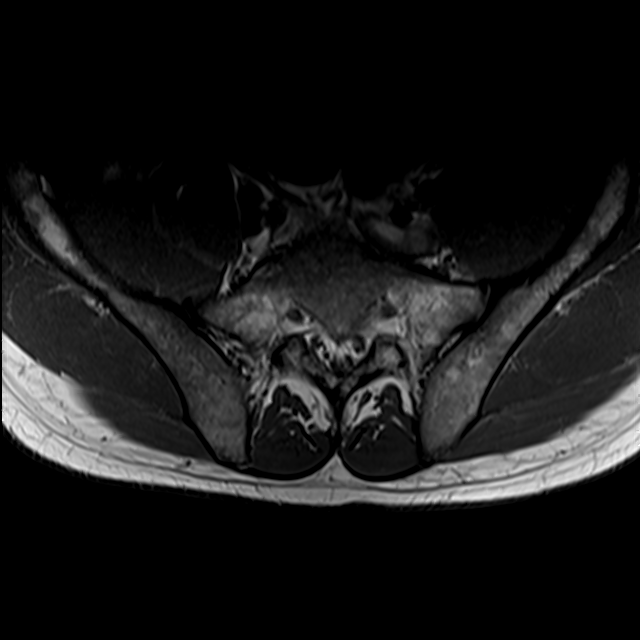
[im 5/31]
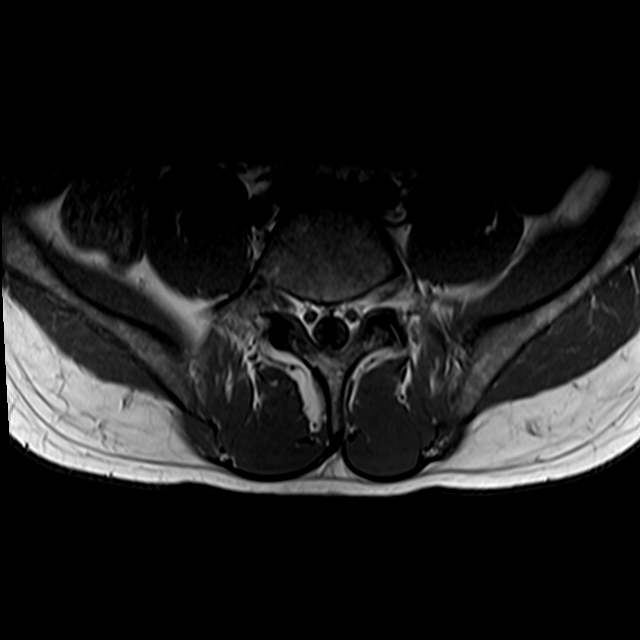
[im 10/31]
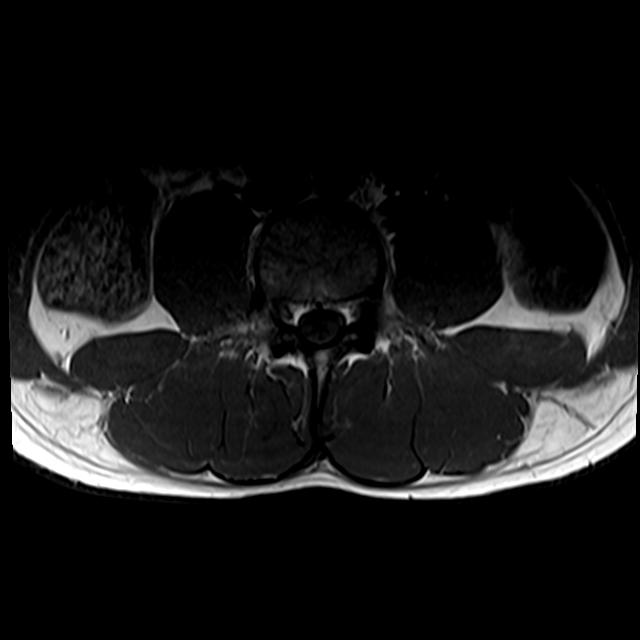
[im 17/31]
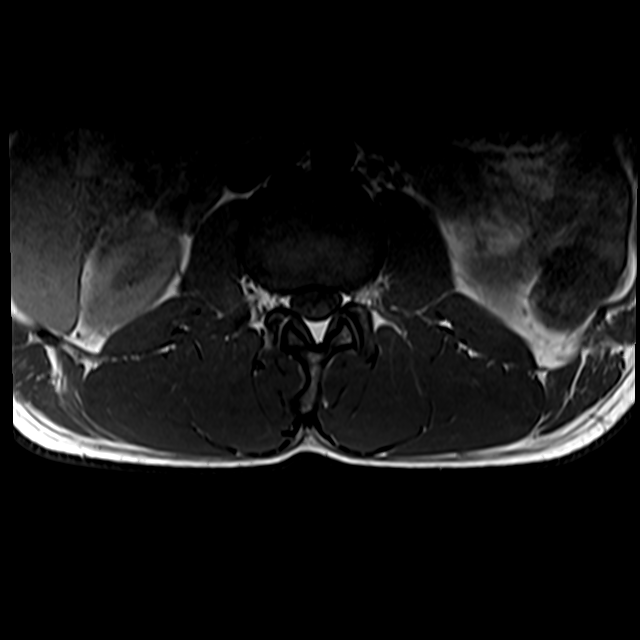
[im 26/31]
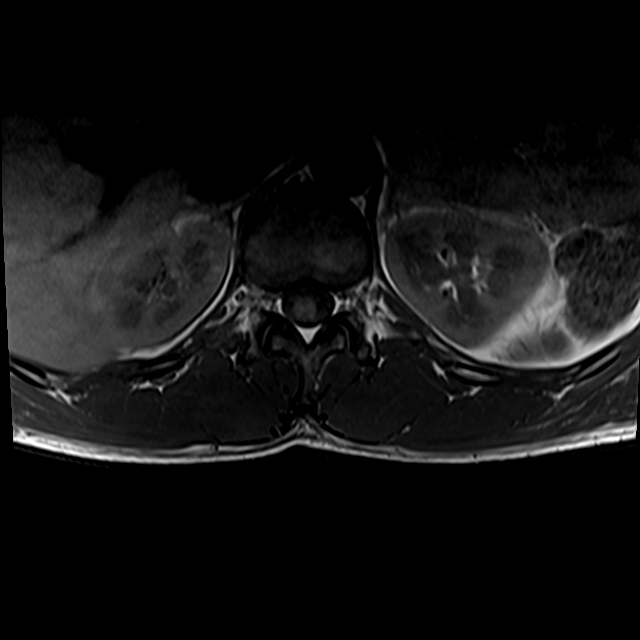

[26 of 48 positions shown; findings below may reference images not displayed]

FINDINGS: Segmentation: Normal, concordant with the thoracic spine numbering
today.

Alignment:  Straightening of lumbar lordosis.  No spondylolisthesis.

Vertebrae: No marrow edema or evidence of acute osseous abnormality.
Visualized bone marrow signal is within normal limits. Intact
visible sacrum and SI joints.

Conus medullaris and cauda equina: Conus extends to the T12-L1
level. No lower spinal cord or conus signal abnormality. Normal
cauda equina nerve roots.

Paraspinal and other soft tissues: Negative.

Disc levels:

L1-L2:  Negative.

L2-L3:  Negative.

L3-L4:  Negative.

L4-L5:  Negative.

L5-S1: Mild disc desiccation. Small central disc protrusion best
seen on series 1, image 10. Minimal other disc bulging. Mild facet
hypertrophy. No spinal or lateral recess stenosis. No foraminal
stenosis.
IMPRESSION: 1. Isolated mild lumbar disc degeneration at L5-S1 with a small
central disc protrusion. No associated spinal stenosis or convincing
neural impingement.
2. Otherwise normal MRI appearance of the lumbar spine.

## 2021-06-12 MED ORDER — CYCLOBENZAPRINE HCL 10 MG PO TABS
10.0000 mg | ORAL_TABLET | Freq: Once | ORAL | Status: AC
  Administered 2021-06-12: 10 mg via ORAL
  Filled 2021-06-12: qty 1

## 2021-06-12 MED ORDER — IBUPROFEN 400 MG PO TABS
600.0000 mg | ORAL_TABLET | Freq: Once | ORAL | Status: AC
  Administered 2021-06-12: 600 mg via ORAL
  Filled 2021-06-12: qty 1

## 2021-06-12 MED ORDER — IBUPROFEN 600 MG PO TABS: 600.0000 mg | ORAL_TABLET | Freq: Four times a day (QID) | ORAL | 0 refills | Status: AC | PRN

## 2021-06-12 MED ORDER — CYCLOBENZAPRINE HCL 10 MG PO TABS: 10.0000 mg | ORAL_TABLET | Freq: Three times a day (TID) | ORAL | 0 refills | Status: AC

## 2021-06-12 NOTE — ED Provider Notes (Addendum)
[facility] EMERGENCY DEPARTMENT Provider Note   CSN: [id number] Arrival date & time: 06/11/21  2245     History Chief Complaint  Patient presents with   [name]    Ross Miller is a 35 y.o. male.  Patient to ED with [facility] from jail after mechanical fall, slipped on wet surface, went backward landing on floor. C/O neck, thoracic and low back pain. No LOC, nausea, vomiting, chest or abdominal pain.   The history is provided by the patient and the police. No language interpreter was used.  Fall Pertinent negatives include no chest pain, no abdominal pain and no shortness of breath.      No past medical history on file.  There are no problems to display for this patient.    No family history on file.     Home Medications Prior to Admission medications   Not on File    Allergies    Patient has no known allergies.  Review of Systems   Review of Systems  Respiratory:  Negative for shortness of breath.   Cardiovascular:  Negative for chest pain.  Gastrointestinal:  Negative for abdominal pain.  Genitourinary:        No incontinence  Musculoskeletal:  Positive for back pain and neck pain.  Neurological:  Negative for syncope, weakness and numbness.   Physical Exam Updated Vital Signs BP (!) 134/94 (BP Location: Left Arm)   Temp 98.2 F (36.8 C) (Oral)   Resp 16   SpO2 100%   Physical Exam Vitals and nursing note reviewed.  Constitutional:      Appearance: He is well-developed.  HENT:     Head: Normocephalic and atraumatic.  Cardiovascular:     Rate and Rhythm: Normal rate.     Heart sounds: No murmur heard. Pulmonary:     Effort: Pulmonary effort is normal.     Breath sounds: No wheezing, rhonchi or rales.  Chest:     Chest wall: No tenderness.  Abdominal:     Palpations: Abdomen is soft.     Tenderness: There is no abdominal tenderness.  Musculoskeletal:        General: Normal range of motion.     Cervical back: Normal  range of motion.     Comments: Spinal tenderness midline, thoracic, cervical and lumbar areas. No swelling or deformity. FROM all extremities. Left side tenderness over iliac crest. No crepitance, deformity.   Skin:    General: Skin is warm and dry.  Neurological:     Mental Status: He is alert and oriented to person, place, and time.     Sensory: No sensory deficit.    ED Results / Procedures / Treatments   Labs (all labs ordered are listed, but only abnormal results are displayed) Labs Reviewed - No data to display  EKG None  Radiology DG Cervical Spine Complete  Result Date: 06/11/2021 CLINICAL DATA:  Mechanical fall on wet surface EXAM: CERVICAL SPINE - COMPLETE 4+ VIEW THORACIC SPINE 2 VIEWS; LUMBAR SPINE - COMPLETE 4+ VIEW DG HIP (WITH OR WITHOUT PELVIS) 2-3V LEFT COMPARISON:  None. FINDINGS: Cervical spine: Stabilization collar in place at the time of exam. No acute cervical spine fracture or traumatic listhesis. No significant prevertebral swelling a gas. Airways patent. No acute abnormality in the upper chest or imaged lung apices. Thoracic spine: 12 thoracic levels. Upper thoracic levels better visualized on the cervical spine images. No acute thoracic spine fracture or traumatic listhesis. Normal bone mineralization. No worrisome osseous lesions.  Included portions of the chest and mediastinum are unremarkable. Lumbar spine: Five lumbar levels. No vertebral body fracture or height loss. No evidence of traumatic listhesis. No spondylolysis or some spondylolisthesis. Normal bone mineralization. No worrisome osseous lesions. No acute soft tissue abnormality. Normal bowel gas pattern. Pelvis: Bones of the pelvis appear intact and congruent. Question some partial fusion across the right superior SI joint. Sacral arcs are contiguous. Proximal femora are intact and normally located. Benign bone island in the left greater trochanter and inferior pubic ramus. Soft tissues are unremarkable.  IMPRESSION: No acute traumatic findings in the cervical, thoracic or lumbar spine. No acute fracture or traumatic malalignment of the bony pelvis. Electronically Signed   By: Kreg Shropshire M.D.   On: 06/11/2021 23:52   DG Thoracic Spine 2 View  Result Date: 06/11/2021 CLINICAL DATA:  Mechanical fall on wet surface EXAM: CERVICAL SPINE - COMPLETE 4+ VIEW THORACIC SPINE 2 VIEWS; LUMBAR SPINE - COMPLETE 4+ VIEW DG HIP (WITH OR WITHOUT PELVIS) 2-3V LEFT COMPARISON:  None. FINDINGS: Cervical spine: Stabilization collar in place at the time of exam. No acute cervical spine fracture or traumatic listhesis. No significant prevertebral swelling a gas. Airways patent. No acute abnormality in the upper chest or imaged lung apices. Thoracic spine: 12 thoracic levels. Upper thoracic levels better visualized on the cervical spine images. No acute thoracic spine fracture or traumatic listhesis. Normal bone mineralization. No worrisome osseous lesions. Included portions of the chest and mediastinum are unremarkable. Lumbar spine: Five lumbar levels. No vertebral body fracture or height loss. No evidence of traumatic listhesis. No spondylolysis or some spondylolisthesis. Normal bone mineralization. No worrisome osseous lesions. No acute soft tissue abnormality. Normal bowel gas pattern. Pelvis: Bones of the pelvis appear intact and congruent. Question some partial fusion across the right superior SI joint. Sacral arcs are contiguous. Proximal femora are intact and normally located. Benign bone island in the left greater trochanter and inferior pubic ramus. Soft tissues are unremarkable. IMPRESSION: No acute traumatic findings in the cervical, thoracic or lumbar spine. No acute fracture or traumatic malalignment of the bony pelvis. Electronically Signed   By: Kreg Shropshire M.D.   On: 06/11/2021 23:52   DG Lumbar Spine Complete  Result Date: 06/11/2021 CLINICAL DATA:  Mechanical fall on wet surface EXAM: CERVICAL SPINE -  COMPLETE 4+ VIEW THORACIC SPINE 2 VIEWS; LUMBAR SPINE - COMPLETE 4+ VIEW DG HIP (WITH OR WITHOUT PELVIS) 2-3V LEFT COMPARISON:  None. FINDINGS: Cervical spine: Stabilization collar in place at the time of exam. No acute cervical spine fracture or traumatic listhesis. No significant prevertebral swelling a gas. Airways patent. No acute abnormality in the upper chest or imaged lung apices. Thoracic spine: 12 thoracic levels. Upper thoracic levels better visualized on the cervical spine images. No acute thoracic spine fracture or traumatic listhesis. Normal bone mineralization. No worrisome osseous lesions. Included portions of the chest and mediastinum are unremarkable. Lumbar spine: Five lumbar levels. No vertebral body fracture or height loss. No evidence of traumatic listhesis. No spondylolysis or some spondylolisthesis. Normal bone mineralization. No worrisome osseous lesions. No acute soft tissue abnormality. Normal bowel gas pattern. Pelvis: Bones of the pelvis appear intact and congruent. Question some partial fusion across the right superior SI joint. Sacral arcs are contiguous. Proximal femora are intact and normally located. Benign bone island in the left greater trochanter and inferior pubic ramus. Soft tissues are unremarkable. IMPRESSION: No acute traumatic findings in the cervical, thoracic or lumbar spine. No acute fracture or  traumatic malalignment of the bony pelvis. Electronically Signed   By: Kreg Shropshire M.D.   On: 06/11/2021 23:52   DG Hip Unilat W or Wo Pelvis 2-3 Views Left  Result Date: 06/11/2021 CLINICAL DATA:  Mechanical fall on wet surface EXAM: CERVICAL SPINE - COMPLETE 4+ VIEW THORACIC SPINE 2 VIEWS; LUMBAR SPINE - COMPLETE 4+ VIEW DG HIP (WITH OR WITHOUT PELVIS) 2-3V LEFT COMPARISON:  None. FINDINGS: Cervical spine: Stabilization collar in place at the time of exam. No acute cervical spine fracture or traumatic listhesis. No significant prevertebral swelling a gas. Airways patent. No  acute abnormality in the upper chest or imaged lung apices. Thoracic spine: 12 thoracic levels. Upper thoracic levels better visualized on the cervical spine images. No acute thoracic spine fracture or traumatic listhesis. Normal bone mineralization. No worrisome osseous lesions. Included portions of the chest and mediastinum are unremarkable. Lumbar spine: Five lumbar levels. No vertebral body fracture or height loss. No evidence of traumatic listhesis. No spondylolysis or some spondylolisthesis. Normal bone mineralization. No worrisome osseous lesions. No acute soft tissue abnormality. Normal bowel gas pattern. Pelvis: Bones of the pelvis appear intact and congruent. Question some partial fusion across the right superior SI joint. Sacral arcs are contiguous. Proximal femora are intact and normally located. Benign bone island in the left greater trochanter and inferior pubic ramus. Soft tissues are unremarkable. IMPRESSION: No acute traumatic findings in the cervical, thoracic or lumbar spine. No acute fracture or traumatic malalignment of the bony pelvis. Electronically Signed   By: Kreg Shropshire M.D.   On: 06/11/2021 23:52    Procedures Procedures   Medications Ordered in ED Medications  ibuprofen (ADVIL) tablet 600 mg (600 mg Oral Given 06/11/21 2256)    ED Course  I have reviewed the triage vital signs and the nursing notes.  Pertinent labs & imaging results that were available during my care of the patient were reviewed by me and considered in my medical decision making (see chart for details).    MDM Rules/Calculators/A&P                           Patient to ED with back and neck pain after mechanical fall. Imaging pending.   Imaging (plain film) reviewed and is negative for fracture. On re-evaluation, the patient reports onset of numbness to his penis (no incontinence) and numbness of the left leg.   Reflexes symmetric in bilateral LE's. No strength deficits on plantar and dorsiflexion.  Pulses present.   Because of the developing symptoms, MRI ordered of the thoracic and lumbar spine. Patient and accompanying sheriffs updated.   5:00 - patient transported to MRI  6:30 - MRI interpretation reviewed. No spinal injury. The patient is able to ambulate without deficit. Reports pain in lumbar back but able to bear weight. Will Rx Flexeril, Ibuprofen.   He is appropriate for discharge home.   Final Clinical Impression(s) / ED Diagnoses Final diagnoses:  None   Fall Back pain Numbness  Rx / DC Orders ED Discharge Orders     None        Elpidio Anis, PA-C 06/12/21 0607    Elpidio Anis, PA-C 06/12/21 9833    Dione Booze, MD 06/12/21 [phone]

## 2021-06-12 NOTE — Discharge Instructions (Addendum)
Rest the back over the next 2-3 days. Cool compresses to reduce any inflammation. Take medications as prescribed.   Return to the ED with any new or concerning symptoms.

## 2021-06-12 NOTE — ED Notes (Signed)
Patient verbalizes understanding of discharge instructions. Prescriptions reviewed. Opportunity for questioning and answers were provided. Armband removed by staff, pt discharged from ED via wheelchair. Paperwork given to Conservator, museum/gallery.
